# Patient Record
Sex: Male | Born: 1985
Health system: Southern US, Community
[De-identification: ages and names within clinical notes are randomized; demographics above are authoritative.]

## PROBLEM LIST (undated history)

## (undated) DIAGNOSIS — M7542 Impingement syndrome of left shoulder: Secondary | ICD-10-CM

## (undated) DIAGNOSIS — M25312 Other instability, left shoulder: Secondary | ICD-10-CM

## (undated) DIAGNOSIS — M24119 Other articular cartilage disorders, unspecified shoulder: Secondary | ICD-10-CM

## (undated) DIAGNOSIS — J45998 Other asthma: Secondary | ICD-10-CM

## (undated) DIAGNOSIS — K219 Gastro-esophageal reflux disease without esophagitis: Secondary | ICD-10-CM

## (undated) HISTORY — PX: WISDOM TOOTH EXTRACTION: SHX21

## (undated) HISTORY — PX: THYROGLOSSAL DUCT CYST: SHX297

## (undated) HISTORY — PX: TONSILLECTOMY AND ADENOIDECTOMY: SHX28

---

## 2009-11-03 ENCOUNTER — Ambulatory Visit (HOSPITAL_BASED_OUTPATIENT_CLINIC_OR_DEPARTMENT_OTHER): Admission: RE | Admit: 2009-11-03 | Discharge: 2009-11-03 | Payer: Self-pay | Admitting: Plastic Surgery

## 2009-11-03 HISTORY — PX: MASS EXCISION: SHX2000

## 2010-12-06 LAB — POCT HEMOGLOBIN-HEMACUE: Hemoglobin: 14.8 g/dL (ref 13.0–17.0)

## 2014-07-07 ENCOUNTER — Ambulatory Visit: Payer: Medicaid Other | Attending: Orthopedic Surgery

## 2014-07-07 DIAGNOSIS — M25519 Pain in unspecified shoulder: Secondary | ICD-10-CM | POA: Diagnosis not present

## 2014-07-07 DIAGNOSIS — Z5189 Encounter for other specified aftercare: Secondary | ICD-10-CM | POA: Diagnosis not present

## 2014-10-13 ENCOUNTER — Other Ambulatory Visit: Payer: Self-pay | Admitting: Orthopedic Surgery

## 2014-11-11 ENCOUNTER — Encounter (HOSPITAL_BASED_OUTPATIENT_CLINIC_OR_DEPARTMENT_OTHER): Payer: Self-pay | Admitting: *Deleted

## 2014-11-15 ENCOUNTER — Encounter (HOSPITAL_BASED_OUTPATIENT_CLINIC_OR_DEPARTMENT_OTHER): Payer: Self-pay | Admitting: *Deleted

## 2014-11-15 DIAGNOSIS — M24119 Other articular cartilage disorders, unspecified shoulder: Secondary | ICD-10-CM

## 2014-11-15 DIAGNOSIS — M7542 Impingement syndrome of left shoulder: Secondary | ICD-10-CM | POA: Insufficient documentation

## 2014-11-15 HISTORY — DX: Impingement syndrome of left shoulder: M75.42

## 2014-11-15 HISTORY — DX: Other articular cartilage disorders, unspecified shoulder: M24.119

## 2014-11-18 ENCOUNTER — Ambulatory Visit (HOSPITAL_BASED_OUTPATIENT_CLINIC_OR_DEPARTMENT_OTHER): Payer: Medicaid Other | Admitting: Anesthesiology

## 2014-11-18 ENCOUNTER — Encounter (HOSPITAL_BASED_OUTPATIENT_CLINIC_OR_DEPARTMENT_OTHER)
Admission: RE | Disposition: A | Discharge status: Home / Self Care | Payer: Self-pay | Source: Ambulatory Visit | Attending: Orthopedic Surgery

## 2014-11-18 ENCOUNTER — Encounter (HOSPITAL_BASED_OUTPATIENT_CLINIC_OR_DEPARTMENT_OTHER): Payer: Self-pay

## 2014-11-18 ENCOUNTER — Ambulatory Visit (HOSPITAL_BASED_OUTPATIENT_CLINIC_OR_DEPARTMENT_OTHER)
Admission: RE | Admit: 2014-11-18 | Discharge: 2014-11-18 | Disposition: A | Discharge status: Home / Self Care | Payer: Medicaid Other | Source: Ambulatory Visit | Attending: Orthopedic Surgery | Admitting: Orthopedic Surgery

## 2014-11-18 DIAGNOSIS — K219 Gastro-esophageal reflux disease without esophagitis: Secondary | ICD-10-CM | POA: Diagnosis not present

## 2014-11-18 DIAGNOSIS — M24112 Other articular cartilage disorders, left shoulder: Secondary | ICD-10-CM | POA: Insufficient documentation

## 2014-11-18 DIAGNOSIS — M7542 Impingement syndrome of left shoulder: Secondary | ICD-10-CM | POA: Diagnosis not present

## 2014-11-18 DIAGNOSIS — M25312 Other instability, left shoulder: Secondary | ICD-10-CM

## 2014-11-18 DIAGNOSIS — J45909 Unspecified asthma, uncomplicated: Secondary | ICD-10-CM | POA: Diagnosis not present

## 2014-11-18 HISTORY — DX: Other articular cartilage disorders, unspecified shoulder: M24.119

## 2014-11-18 HISTORY — DX: Other asthma: J45.998

## 2014-11-18 HISTORY — DX: Gastro-esophageal reflux disease without esophagitis: K21.9

## 2014-11-18 HISTORY — PX: SHOULDER ARTHROSCOPY WITH SUBACROMIAL DECOMPRESSION: SHX5684

## 2014-11-18 HISTORY — DX: Other instability, left shoulder: M25.312

## 2014-11-18 HISTORY — DX: Impingement syndrome of left shoulder: M75.42

## 2014-11-18 LAB — POCT HEMOGLOBIN-HEMACUE: Hemoglobin: 15.1 g/dL (ref 13.0–17.0)

## 2014-11-18 SURGERY — SHOULDER ARTHROSCOPY WITH SUBACROMIAL DECOMPRESSION
Anesthesia: Regional | Site: Shoulder | Laterality: Left

## 2014-11-18 SURGERY — SHOULDER ARTHROSCOPY WITH SUBACROMIAL DECOMPRESSION
Anesthesia: General | Laterality: Left

## 2014-11-18 MED ORDER — CEFAZOLIN SODIUM-DEXTROSE 2-3 GM-% IV SOLR
2.0000 g | INTRAVENOUS | Status: AC
  Administered 2014-11-18: 2 g via INTRAVENOUS

## 2014-11-18 MED ORDER — OXYCODONE HCL 5 MG PO TABS
ORAL_TABLET | ORAL | Status: AC
  Filled 2014-11-18: qty 1

## 2014-11-18 MED ORDER — PROPOFOL 10 MG/ML IV EMUL
INTRAVENOUS | Status: AC
  Filled 2014-11-18: qty 50

## 2014-11-18 MED ORDER — ONDANSETRON HCL 4 MG PO TABS: 4.0000 mg | ORAL_TABLET | Freq: Three times a day (TID) | ORAL | Status: DC | PRN

## 2014-11-18 MED ORDER — MIDAZOLAM HCL 2 MG/2ML IJ SOLN
INTRAMUSCULAR | Status: AC
  Filled 2014-11-18: qty 2

## 2014-11-18 MED ORDER — HYDROMORPHONE HCL 1 MG/ML IJ SOLN: 0.2500 mg | INTRAMUSCULAR | Status: DC | PRN

## 2014-11-18 MED ORDER — FENTANYL CITRATE 0.05 MG/ML IJ SOLN
INTRAMUSCULAR | Status: AC
  Filled 2014-11-18: qty 6

## 2014-11-18 MED ORDER — SODIUM CHLORIDE 0.9 % IR SOLN
Status: DC | PRN
  Administered 2014-11-18: 8000 mL

## 2014-11-18 MED ORDER — SENNA-DOCUSATE SODIUM 8.6-50 MG PO TABS: 2.0000 | ORAL_TABLET | Freq: Every day | ORAL | Status: DC

## 2014-11-18 MED ORDER — ONDANSETRON HCL 4 MG/2ML IJ SOLN
INTRAMUSCULAR | Status: DC | PRN
  Administered 2014-11-18: 4 mg via INTRAVENOUS

## 2014-11-18 MED ORDER — CEFAZOLIN SODIUM-DEXTROSE 2-3 GM-% IV SOLR: 2.0000 g | INTRAVENOUS | Status: DC

## 2014-11-18 MED ORDER — PROPOFOL 10 MG/ML IV BOLUS
INTRAVENOUS | Status: DC | PRN
  Administered 2014-11-18: 200 mg via INTRAVENOUS

## 2014-11-18 MED ORDER — BUPIVACAINE-EPINEPHRINE (PF) 0.5% -1:200000 IJ SOLN
INTRAMUSCULAR | Status: DC | PRN
  Administered 2014-11-18: 30 mL via PERINEURAL

## 2014-11-18 MED ORDER — FENTANYL CITRATE 0.05 MG/ML IJ SOLN
50.0000 ug | INTRAMUSCULAR | Status: DC | PRN
  Administered 2014-11-18: 100 ug via INTRAVENOUS

## 2014-11-18 MED ORDER — DEXAMETHASONE SODIUM PHOSPHATE 4 MG/ML IJ SOLN
INTRAMUSCULAR | Status: DC | PRN
  Administered 2014-11-18: 10 mg via INTRAVENOUS

## 2014-11-18 MED ORDER — MIDAZOLAM HCL 2 MG/2ML IJ SOLN
1.0000 mg | INTRAMUSCULAR | Status: DC | PRN
  Administered 2014-11-18: 2 mg via INTRAVENOUS

## 2014-11-18 MED ORDER — FENTANYL CITRATE 0.05 MG/ML IJ SOLN
INTRAMUSCULAR | Status: AC
  Filled 2014-11-18: qty 2

## 2014-11-18 MED ORDER — LACTATED RINGERS IV SOLN
INTRAVENOUS | Status: DC
  Administered 2014-11-18 (×3): via INTRAVENOUS

## 2014-11-18 MED ORDER — CEFAZOLIN SODIUM-DEXTROSE 2-3 GM-% IV SOLR
INTRAVENOUS | Status: AC
  Filled 2014-11-18: qty 50

## 2014-11-18 MED ORDER — MIDAZOLAM HCL 2 MG/ML PO SYRP: 12.0000 mg | ORAL_SOLUTION | Freq: Once | ORAL | Status: DC | PRN

## 2014-11-18 MED ORDER — BACLOFEN 10 MG PO TABS: 10.0000 mg | ORAL_TABLET | Freq: Three times a day (TID) | ORAL | Status: DC

## 2014-11-18 MED ORDER — SUCCINYLCHOLINE CHLORIDE 20 MG/ML IJ SOLN
INTRAMUSCULAR | Status: DC | PRN
  Administered 2014-11-18: 100 mg via INTRAVENOUS

## 2014-11-18 MED ORDER — FENTANYL CITRATE 0.05 MG/ML IJ SOLN
INTRAMUSCULAR | Status: DC | PRN
  Administered 2014-11-18: 50 ug via INTRAVENOUS

## 2014-11-18 MED ORDER — OXYCODONE-ACETAMINOPHEN 5-325 MG PO TABS: 1.0000 | ORAL_TABLET | Freq: Four times a day (QID) | ORAL | Status: DC | PRN

## 2014-11-18 MED ORDER — OXYCODONE HCL 5 MG PO TABS
5.0000 mg | ORAL_TABLET | Freq: Once | ORAL | Status: AC
  Administered 2014-11-18: 5 mg via ORAL

## 2014-11-18 SURGICAL SUPPLY — 61 items
ANCHOR SUT BIOCOMP LK 2.9X12.5 (Anchor) ×4 IMPLANT
BLADE CUTTER GATOR 3.5 (BLADE) ×4 IMPLANT
BLADE GREAT WHITE 4.2 (BLADE) IMPLANT
BLADE SURG 15 STRL LF DISP TIS (BLADE) IMPLANT
BLADE SURG 15 STRL SS (BLADE)
BUR OVAL 6.0 (BURR) ×4 IMPLANT
CANNULA 5.75X71 LONG (CANNULA) ×4 IMPLANT
CANNULA TWIST IN 8.25X7CM (CANNULA) ×4 IMPLANT
CLSR STERI-STRIP ANTIMIC 1/2X4 (GAUZE/BANDAGES/DRESSINGS) ×4 IMPLANT
DECANTER SPIKE VIAL GLASS SM (MISCELLANEOUS) IMPLANT
DRAPE INCISE IOBAN 66X45 STRL (DRAPES) ×4 IMPLANT
DRAPE SHOULDER BEACH CHAIR (DRAPES) ×4 IMPLANT
DRAPE U 20/CS (DRAPES) ×4 IMPLANT
DRAPE U-SHAPE 47X51 STRL (DRAPES) ×4 IMPLANT
DRSG PAD ABDOMINAL 8X10 ST (GAUZE/BANDAGES/DRESSINGS) ×4 IMPLANT
DURAPREP 26ML APPLICATOR (WOUND CARE) ×4 IMPLANT
ELECT REM PT RETURN 9FT ADLT (ELECTROSURGICAL)
ELECTRODE REM PT RTRN 9FT ADLT (ELECTROSURGICAL) IMPLANT
GAUZE SPONGE 4X4 12PLY STRL (GAUZE/BANDAGES/DRESSINGS) ×4 IMPLANT
GLOVE BIO SURGEON STRL SZ8 (GLOVE) ×4 IMPLANT
GLOVE BIOGEL PI IND STRL 7.0 (GLOVE) ×3 IMPLANT
GLOVE BIOGEL PI IND STRL 8 (GLOVE) ×6 IMPLANT
GLOVE BIOGEL PI INDICATOR 7.0 (GLOVE) ×1
GLOVE BIOGEL PI INDICATOR 8 (GLOVE) ×2
GLOVE ECLIPSE 6.5 STRL STRAW (GLOVE) ×8 IMPLANT
GLOVE ORTHO TXT STRL SZ7.5 (GLOVE) ×4 IMPLANT
GOWN STRL REUS W/ TWL LRG LVL3 (GOWN DISPOSABLE) ×3 IMPLANT
GOWN STRL REUS W/ TWL XL LVL3 (GOWN DISPOSABLE) ×6 IMPLANT
GOWN STRL REUS W/TWL LRG LVL3 (GOWN DISPOSABLE) ×1
GOWN STRL REUS W/TWL XL LVL3 (GOWN DISPOSABLE) ×2
IMMOBILIZER SHOULDER FOAM XLGE (SOFTGOODS) IMPLANT
IV NS IRRIG 3000ML ARTHROMATIC (IV SOLUTION) ×12 IMPLANT
KIT SHOULDER TRACTION (DRAPES) ×4 IMPLANT
LASSO 90 CVE QUICKPAS (DISPOSABLE) ×4 IMPLANT
MANIFOLD NEPTUNE II (INSTRUMENTS) ×4 IMPLANT
PACK ARTHROSCOPY DSU (CUSTOM PROCEDURE TRAY) ×4 IMPLANT
PACK BASIN DAY SURGERY FS (CUSTOM PROCEDURE TRAY) ×4 IMPLANT
SET ARTHROSCOPY TUBING (MISCELLANEOUS) ×1
SET ARTHROSCOPY TUBING LN (MISCELLANEOUS) ×3 IMPLANT
SHEET MEDIUM DRAPE 40X70 STRL (DRAPES) ×4 IMPLANT
SLEEVE SCD COMPRESS KNEE MED (MISCELLANEOUS) ×4 IMPLANT
SLING ARM IMMOBILIZER LRG (SOFTGOODS) ×4 IMPLANT
SLING ARM IMMOBILIZER MED (SOFTGOODS) IMPLANT
SLING ARM LRG ADULT FOAM STRAP (SOFTGOODS) IMPLANT
SLING ARM MED ADULT FOAM STRAP (SOFTGOODS) IMPLANT
SLING ARM XL FOAM STRAP (SOFTGOODS) IMPLANT
SUT FIBERWIRE #2 38 T-5 BLUE (SUTURE)
SUT MNCRL AB 4-0 PS2 18 (SUTURE) IMPLANT
SUT PDS AB 1 CT  36 (SUTURE)
SUT PDS AB 1 CT 36 (SUTURE) IMPLANT
SUT TIGER TAPE 7 IN WHITE (SUTURE) IMPLANT
SUT VIC AB 3-0 SH 27 (SUTURE)
SUT VIC AB 3-0 SH 27X BRD (SUTURE) IMPLANT
SUTURE FIBERWR #2 38 T-5 BLUE (SUTURE) IMPLANT
TAPE FIBER 2MM 7IN #2 BLUE (SUTURE) IMPLANT
TAPE SUT LABRALTAP WHT/BLK (SUTURE) ×4 IMPLANT
TOWEL OR 17X24 6PK STRL BLUE (TOWEL DISPOSABLE) ×4 IMPLANT
TOWEL OR NON WOVEN STRL DISP B (DISPOSABLE) ×4 IMPLANT
TUBE CONNECTING 20X1/4 (TUBING) IMPLANT
WAND STAR VAC 90 (SURGICAL WAND) ×4 IMPLANT
WATER STERILE IRR 1000ML POUR (IV SOLUTION) ×4 IMPLANT

## 2014-11-18 NOTE — Anesthesia Procedure Notes (Addendum)
Anesthesia Regional Block:  Interscalene brachial plexus block  Pre-Anesthetic Checklist: ,, timeout performed, Correct Patient, Correct Site, Correct Laterality, Correct Procedure, Correct Position, site marked, Risks and benefits discussed, pre-op evaluation,  At surgeon's request and post-op pain management  Laterality: Left  Prep: Maximum Sterile Barrier Precautions used and chloraprep       Needles:  Injection technique: Single-shot  Needle Type: Echogenic Stimulator Needle     Needle Length: 5cm 5 cm Needle Gauge: 22 and 22 G    Additional Needles:  Procedures: ultrasound guided (picture in chart) and nerve stimulator Interscalene brachial plexus block  Nerve Stimulator or Paresthesia:  Response: Biceps response,   Additional Responses:   Narrative:  Start time: 11/18/2014 10:20 AM End time: 11/18/2014 10:29 AM Injection made incrementally with aspirations every 5 mL. Anesthesiologist: Gaynelle AduFITZGERALD, WILLIAM E  Additional Notes: 2% Lidocaine skin wheel.    Procedure Name: Intubation Date/Time: 11/18/2014 12:53 PM Performed by: Zenia ResidesPAYNE, Eligh Rybacki D Pre-anesthesia Checklist: Patient identified, Emergency Drugs available, Suction available and Patient being monitored Patient Re-evaluated:Patient Re-evaluated prior to inductionOxygen Delivery Method: Circle System Utilized Preoxygenation: Pre-oxygenation with 100% oxygen Intubation Type: IV induction Ventilation: Mask ventilation without difficulty Laryngoscope Size: Mac and 3 Grade View: Grade I Tube type: Oral Number of attempts: 1 Airway Equipment and Method: Stylet and Oral airway Placement Confirmation: ETT inserted through vocal cords under direct vision,  positive ETCO2 and breath sounds checked- equal and bilateral Secured at: 23 cm Tube secured with: Tape Dental Injury: Teeth and Oropharynx as per pre-operative assessment

## 2014-11-18 NOTE — Anesthesia Preprocedure Evaluation (Addendum)
Anesthesia Evaluation  Patient identified by MRN, date of birth, ID band Patient awake    Reviewed: Allergy & Precautions, H&P , NPO status , Patient's Chart, lab work & pertinent test results  Airway Mallampati: II  TM Distance: >3 FB Neck ROM: Full    Dental no notable dental hx. (+) Teeth Intact, Dental Advisory Given   Pulmonary asthma ,  breath sounds clear to auscultation  Pulmonary exam normal       Cardiovascular negative cardio ROS  Rhythm:Regular Rate:Normal     Neuro/Psych negative neurological ROS  negative psych ROS   GI/Hepatic Neg liver ROS, GERD-  Medicated and Controlled,  Endo/Other  negative endocrine ROS  Renal/GU negative Renal ROS  negative genitourinary   Musculoskeletal   Abdominal   Peds  Hematology negative hematology ROS (+)   Anesthesia Other Findings   Reproductive/Obstetrics negative OB ROS                            Anesthesia Physical Anesthesia Plan  ASA: II  Anesthesia Plan: General and Regional   Post-op Pain Management:    Induction: Intravenous  Airway Management Planned: Oral ETT  Additional Equipment:   Intra-op Plan:   Post-operative Plan: Extubation in OR  Informed Consent: I have reviewed the patients History and Physical, chart, labs and discussed the procedure including the risks, benefits and alternatives for the proposed anesthesia with the patient or authorized representative who has indicated his/her understanding and acceptance.   Dental advisory given  Plan Discussed with: CRNA  Anesthesia Plan Comments:         Anesthesia Quick Evaluation

## 2014-11-18 NOTE — Anesthesia Postprocedure Evaluation (Signed)
  Anesthesia Post-op Note  Patient: Ian Stevens  Procedure(s) Performed: Procedure(s): LEFT SHOULDER ARTHROSCOPY, ACROMIOPLASTY, DEBRIDEMENT OF LABRAL TEAR, CAPSULORRHAPHY (Left) SHOULDER ATHROSCOPY WITH CAPSULORRHAPHY  Patient Location: PACU  Anesthesia Type:General and block  Level of Consciousness: awake and alert   Airway and Oxygen Therapy: Patient Spontanous Breathing  Post-op Pain: none  Post-op Assessment: Post-op Vital signs reviewed, Patient's Cardiovascular Status Stable and Respiratory Function Stable  Post-op Vital Signs: Reviewed  Filed Vitals:   11/18/14 1500  BP:   Pulse: 100  Temp:   Resp: 9    Complications: No apparent anesthesia complications

## 2014-11-18 NOTE — Progress Notes (Signed)
Assisted Dr. Edmond Fitzgerald with left, ultrasound guided, interscalene  block. Side rails up, monitors on throughout procedure. See vital signs in flow sheet. Tolerated Procedure well.  

## 2014-11-18 NOTE — H&P (Signed)
PREOPERATIVE H&P  Chief Complaint: Left shoulder pain  HPI: Ian Stevens is a 29 y.o. male who presents for preoperative history and physical with a diagnosis of left shoulder pain that has been persistent for at least 2 years, worse with lifting and activities of overhead motion. Pain is located laterally. He has had injections, physical therapy, and has had persistent symptoms.  Symptoms are rated as moderate to severe, and have been worsening.  This is significantly impairing activities of daily living.  He has elected for surgical management.   Past Medical History  Diagnosis Date  . GERD (gastroesophageal reflux disease)     no current med.  . Seasonal asthma     states has not used inhaler in years  . 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 Impingement syndrome of left shoulder 11/2014   Past Surgical History  Procedure Laterality Date  . Mass excision  11/03/2009    multiple masses forehead, left eyebrow, abd., left thumb  . Wisdom tooth extraction    . Tonsillectomy and adenoidectomy    . Thyroglossal duct cyst     History   Social History  . Marital Status: Single    Spouse Name: N/A  . Number of Children: N/A  . Years of Education: N/A   Social History Main Topics  . Smoking status: Never Smoker   . Smokeless tobacco: Never Used  . Alcohol Use: No  . Drug Use: No  . Sexual Activity: Not on file   Other Topics Concern  . None   Social History Narrative   History reviewed. No pertinent family history. No Known Allergies Prior to Admission medications   Not on File     Positive ROS: All other systems have been reviewed and were otherwise negative with the exception of those mentioned in the HPI and as above.  Physical Exam: General: Alert, no acute distress Cardiovascular: No pedal edema Respiratory: No cyanosis, no use of accessory musculature GI: No organomegaly, abdomen is soft and non-tender Skin: No lesions in  the area of chief complaint Neurologic: Sensation intact distally Psychiatric: Patient is competent for consent with normal mood and affect Lymphatic: No axillary or cervical lymphadenopathy  MUSCULOSKELETAL: Left shoulder has active motion 0 175 with positive impingement signs, no pain over the acromioclavicular joint, mild pain over the biceps, moderate Neer and Hawkins signs positive.  Assessment: Left shoulder impingement syndrome, possible labral pathology with persistent pain  Plan: Plan for Procedure(s): LEFT SHOULDER ARTHROSCOPY DEBRIDEMENT,ACROMIOPLASTY, possible labral repair  The risks benefits and alternatives were discussed with the patient including but not limited to the risks of nonoperative treatment, versus surgical intervention including infection, bleeding, nerve injury,  blood clots, cardiopulmonary complications, morbidity, mortality, among others, and they were willing to proceed. We have also discussed the risks for incomplete relief of symptoms, stiffness, among others.  Eulas PostLANDAU,Malon Siddall P, MD Cell [phone]   11/18/2014 9:20 AM

## 2014-11-18 NOTE — Discharge Instructions (Signed)
Diet: As you were doing prior to hospitalization   Shower:  May shower but keep the wounds dry, use an occlusive plastic wrap, NO SOAKING IN TUB.  If the bandage gets wet, change with a clean dry gauze.  Dressing:  You may change your dressing 3-5 days after surgery.  Then change the dressing daily with sterile gauze dressing.    There are sticky tapes (steri-strips) on your wounds and all the stitches are absorbable.  Leave the steri-strips in place when changing your dressings, they will peel off with time, usually 2-3 weeks.  Activity:  Increase activity slowly as tolerated, but follow the weight bearing instructions below.  No lifting or driving for 6 weeks.  Weight Bearing:   Sling at all times, ok for hand, wrist and elbow motion..    To prevent constipation: you may use a stool softener such as -  Colace (over the counter) 100 mg by mouth twice a day  Drink plenty of fluids (prune juice may be helpful) and high fiber foods Miralax (over the counter) for constipation as needed.    Itching:  If you experience itching with your medications, try taking only a single pain pill, or even half a pain pill at a time.  You may take up to 10 pain pills per day, and you can also use benadryl over the counter for itching or also to help with sleep.   Precautions:  If you experience chest pain or shortness of breath - call 911 immediately for transfer to the hospital emergency department!!  If you develop a fever greater that 101 F, purulent drainage from wound, increased redness or drainage from wound, or calf pain -- Call the office at [phone]                                                Follow- Up Appointment:  Please call for an appointment to be seen in 2 [place] - [phone]   Post Anesthesia Home Care Instructions  Activity: Get plenty of rest for the remainder of the day. A responsible adult should stay with you for 24 hours following the procedure.  For the next 24  hours, DO NOT: -Drive a car -Advertising copywriterperate machinery -Drink alcoholic beverages -Take any medication unless instructed by your physician -Make any legal decisions or sign important papers.  Meals: Start with liquid foods such as gelatin or soup. Progress to regular foods as tolerated. Avoid greasy, spicy, heavy foods. If nausea and/or vomiting occur, drink only clear liquids until the nausea and/or vomiting subsides. Call your physician if vomiting continues.  Special Instructions/Symptoms: Your throat may feel dry or sore from the anesthesia or the breathing tube placed in your throat during surgery. If this causes discomfort, gargle with warm salt water. The discomfort should disappear within 24 hours.   Regional Anesthesia Blocks  1. Numbness or the inability to move the "blocked" extremity may last from 3-48 hours after placement. The length of time depends on the medication injected and your individual response to the medication. If the numbness is not going away after 48 hours, call your surgeon.  2. The extremity that is blocked will need to be protected until the numbness is gone and the  Strength has returned. Because you cannot feel it, you will need to take extra care to avoid injury. Because it may  be weak, you may have difficulty moving it or using it. You may not know what position it is in without looking at it while the block is in effect.  3. For blocks in the legs and feet, returning to weight bearing and walking needs to be done carefully. You will need to wait until the numbness is entirely gone and the strength has returned. You should be able to move your leg and foot normally before you try and bear weight or walk. You will need someone to be with you when you first try to ensure you do not fall and possibly risk injury.  4. Bruising and tenderness at the needle site are common side effects and will resolve in a few days.  5. Persistent numbness or new problems with movement  should be communicated to the surgeon or the [facility] [phone] [facility] [phone]).

## 2014-11-18 NOTE — Op Note (Signed)
11/18/2014  2:17 PM  PATIENT:  Ian Stevens    PRE-OPERATIVE DIAGNOSIS:  Left shoulder impingement syndrome, possible labral tear  POST-OPERATIVE DIAGNOSIS:  Left shoulder impingement syndrome, multidirectional instability, primarily anterior, with small inferior labral tear  PROCEDURE:  LEFT SHOULDER ARTHROSCOPY, ACROMIOPLASTY, DEBRIDEMENT OF LABRAL TEAR, CAPSULORRHAPHY, SHOULDER ATHROSCOPY WITH CAPSULORRHAPHY  SURGEON:  Eulas PostLANDAU,Lizandro Spellman P, MD  PHYSICIAN ASSISTANT: Janace LittenBrandon Parry, OPA-C, present and scrubbed throughout the case, critical for completion in a timely fashion, and for retraction, instrumentation, and closure.  ANESTHESIA:   General  PREOPERATIVE INDICATIONS:  Ian Stevens is a  29 y.o. male who had recurrent episodic left shoulder pain after extensive activity. He failed injections, activity modification, physical therapy, anti-inflammatories, for over 2 years.  The risks benefits and alternatives were discussed with the patient preoperatively including but not limited to the risks of infection, bleeding, nerve injury, cardiopulmonary complications, the need for revision surgery, among others, and the patient was willing to proceed.  OPERATIVE IMPLANTS: Arthrex bio composite 2.9 mm push lock 1 with inverted labral tape  OPERATIVE FINDINGS: Shoulder had full motion during examination under anesthesia, in fact was hypermobile. I could subluxate him anteriorly, and felt a reasonable click. The articular cartilage surfaces were all totally normal, but there was a small inferior labral tear that was at the 6:00 position. Because of his multidirectional instability and patulous capsule I was actually able to access the 6:00 position with the shaver relatively easily. The posterior labrum, superior labrum, biceps tendon, subscapularis, supraspinatus, infraspinatus were all completely normal. The biceps pulley was completely normal. The shoulder did appear to be unstable  anteriorly.  From the subacromial space the bursa was somewhat thickened, and there was some fraying of the CA ligament, but the rotator cuff was completely intact from above.  OPERATIVE PROCEDURE: The patient is brought to the operating room and placed in supine position. Gen. anesthesia was administered. IV antibiotics were given. Time out was performed. The left upper extremity was prepped and draped in the sterile usual sterile fashion after examination under anesthesia demonstrated primarily anterior instability.  Diagnostic arthroscopy demonstrated the above-named findings. I used the arthroscopic shaver to debride the inferior labrum, and evaluated the patulousness of the capsule, and decided to go to the subacromial space to inspect the rotator cuff prior to proceeding with capsulorrhaphy.  The subacromial space and performed a complete bursectomy, evaluated the CA ligament and did in fact find some fraying, the acromioclavicular joint was pristine and I did not expose that. After releasing the CA ligament I performed a light acromioplasty with a bur. I confirmed appropriate acromioplasty from the lateral view as well.  I then went back into the joint, placed an anterior cannula as well as an anterior viewing portal, with the posterior cannula, and used a rasp to prepare the capsule, and then placed a horizontal mattress suture on either side of the inferior glenohumeral ligament. The inferior pass did not go through the labrum, but exited just adjacent to the labrum. The superior past did go just under the labrum, and I placed the anchor right at the exit point where the suture exited the labrum. This provided excellent imbrication of the inferior glenohumeral ligament and reduction in capsular volume correcting the multidirectional instability/capsular redundancy.  The instruments were then removed, the portals closed with Monocryl followed by Steri-Strips and sterile gauze. He was awakened and  returned to the PACU in stable and satisfactory condition. The patient tolerated the procedure well and there were no complications.

## 2014-11-18 NOTE — Transfer of Care (Signed)
Immediate Anesthesia Transfer of Care Note  Patient: Ian Stevens Friday  Procedure(s) Performed: Procedure(s): LEFT SHOULDER ARTHROSCOPY, ACROMIOPLASTY, DEBRIDEMENT OF LABRAL TEAR, CAPSULORRHAPHY (Left) SHOULDER ATHROSCOPY WITH CAPSULORRHAPHY  Patient Location: PACU  Anesthesia Type:General and Regional  Level of Consciousness: awake and alert   Airway & Oxygen Therapy: Patient Spontanous Breathing and Patient connected to face mask oxygen  Post-op Assessment: Report given to RN and Post -op Vital signs reviewed and stable  Post vital signs: Reviewed and stable  Last Vitals:  Filed Vitals:   11/18/14 1035  BP: 120/62  Pulse: 90  Temp:   Resp: 24    Complications: No apparent anesthesia complications

## 2014-11-21 ENCOUNTER — Encounter (HOSPITAL_BASED_OUTPATIENT_CLINIC_OR_DEPARTMENT_OTHER): Payer: Self-pay | Admitting: Orthopedic Surgery

## 2015-01-05 ENCOUNTER — Ambulatory Visit: Payer: Medicaid Other | Attending: Orthopedic Surgery

## 2015-01-05 DIAGNOSIS — M25622 Stiffness of left elbow, not elsewhere classified: Secondary | ICD-10-CM | POA: Diagnosis not present

## 2015-01-05 DIAGNOSIS — M79622 Pain in left upper arm: Secondary | ICD-10-CM | POA: Diagnosis not present

## 2015-01-05 NOTE — Therapy (Signed)
[facility] [address] [place], [place], [address] Phone: [phone]   Fax:  [phone]  Physical Therapy Evaluation  Patient Details  Name: Ian Stevens MRN: [id number] Date of Birth: [birth year] Referring Provider:  Teryl Lucy, MD  Encounter Date: 01/05/2015      PT End of Session - 01/05/15 0940    Visit Number 1   Number of Visits 4   Date for PT Re-Evaluation 03/16/15   PT Start Time 0845   PT Stop Time 0930   PT Time Calculation (min) 45 min   Activity Tolerance Patient tolerated treatment well   Behavior During Therapy [facility] for tasks assessed/performed      Past Medical History  Diagnosis Date  . GERD (gastroesophageal reflux disease)     no current med.  . Seasonal asthma     states has not used inhaler in years  . 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 Impingement syndrome of left shoulder 11/2014  . Instability of left shoulder joint 11/18/2014    Past Surgical History  Procedure Laterality Date  . Mass excision  11/03/2009    multiple masses forehead, left eyebrow, abd., left thumb  . Wisdom tooth extraction    . Tonsillectomy and adenoidectomy    . Thyroglossal duct cyst    . Shoulder arthroscopy with subacromial decompression Left 11/18/2014    Procedure: LEFT SHOULDER ARTHROSCOPY, ACROMIOPLASTY, DEBRIDEMENT OF LABRAL TEAR, CAPSULORRHAPHY;  Surgeon: Teryl Lucy, MD;  Location: [facility];  Service: Orthopedics;  Laterality: Left;    There were no vitals filed for this visit.  Visit Diagnosis:  Stiffness of left upper arm joint - Plan: PT plan of care cert/re-cert  Pain of left upper arm - Plan: PT plan of care cert/re-cert      Subjective Assessment - 01/05/15 0851    Subjective He reports dull soreness possibly due to sleeping on shoulder.    Pertinent History He reports shoulder pain for 5-6 years. 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 Pain worsenened with activity.    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    Currently in Pain? Yes   Pain Score 2   mild   Pain Location Shoulder   Pain Orientation Left   Pain Descriptors / Indicators Dull   Pain Type Surgical pain   Pain Onset More than a month ago   Pain Frequency Intermittent   Aggravating Factors  Using arm, exercise   Pain Relieving Factors medication and rest   Effect of Pain on Daily Activities Limited as above   Multiple Pain Sites No            [facility] PT Assessment - 01/05/15 0844    Assessment   Medical Diagnosis Arthroscope LT shoulder with labral repair   Onset Date 11/18/14   Precautions   Precaution Comments No lifting any weight.    Restrictions   Weight Bearing Restrictions Yes   Balance Screen   Has the patient fallen in the past 6 months No   Has the patient had a decrease in activity level because of a fear of falling?  No   Is the patient reluctant to leave their home because of a fear of falling?  No   Prior Function   Level of Independence Independent with basic ADLs   Cognition   Overall Cognitive Status Within  Functional Limits for tasks assessed   Observation/Other Assessments   Focus on Therapeutic Outcomes (FOTO)  50%   ROM / Strength   AROM / PROM / Strength AROM;PROM;Strength   AROM   AROM Assessment Site Shoulder   Right/Left Shoulder Right;Left   Right Shoulder Flexion 150 Degrees   Right Shoulder ABduction 155 Degrees   Right Shoulder Internal Rotation 66 Degrees   Right Shoulder External Rotation 108 Degrees   Right Shoulder Horizontal ABduction 33 Degrees   Right Shoulder Horizontal  ADduction 23 Degrees   Left Shoulder Flexion 83 Degrees   Left Shoulder ABduction 85 Degrees   Left Shoulder Internal Rotation 40 Degrees   Left Shoulder External Rotation 25 Degrees   Left Shoulder Horizontal ABduction 10 Degrees   Left  Shoulder Horizontal ADduction 10 Degrees   PROM   PROM Assessment Site Shoulder   Right/Left Shoulder Left   Strength   Strength Assessment Site Shoulder   Right/Left Shoulder Left   Ambulation/Gait   Gait Comments Normal                           PT Education - 01/05/15 0940    Education provided Yes   Education Details POC , HEP   Person(s) Educated Patient   Methods Explanation;Demonstration;Tactile cues;Verbal cues;Handout   Comprehension Returned demonstration;Verbalized understanding             PT Long Term Goals - 01/05/15 0945    PT LONG TERM GOAL #1   Title He will be independent with all HEP issued as of last visit   Time 10   Period Weeks   Status New   PT LONG TERM GOAL #2   Title Full range of Lt shoulde and return to working out for strength and return to work.    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 Now at 6-7 week in protocol. He will be in week 8 probably when he return and we will advance to that level at that time . He should progress well if he commits to HEP.    Pt will benefit from skilled therapeutic intervention in order to improve on the following deficits Decreased range of motion;Pain;Impaired UE functional use;Decreased strength   Rehab Potential Good   PT Frequency --  3 visits over 8 weeks to 10 weeks   PT Treatment/Interventions Cryotherapy;Patient/family education;Therapeutic exercise;Manual techniques;Passive range of motion   PT Next Visit Plan Review rnge and HEP and progress to appropriate level   PT Home Exercise Plan isometric and cane exercises   Consulted and Agree with Plan of Care Patient         Problem List Patient  Active Problem List   Diagnosis Date Noted  . Instability of left shoulder joint 11/18/2014    Caprice RedChasse, Leam Madero M PT 01/05/2015, 9:50 AM  [facility] [address] [place], [place], [id number] Phone: [phone]   Fax:  [phone]

## 2015-01-05 NOTE — Patient Instructions (Signed)
Isometric IR/ER , supine cane exercise and prone scapula retraction x/day 10-15 reps, hold 5-10 sec(isometrics

## 2015-01-05 NOTE — Therapy (Signed)
[facility] [address] [place], [place], [address] Phone: [phone]   Fax:  [phone]  Physical Therapy Evaluation  Patient Details  Name: Ian Stevens MRN: [id number] Date of Birth: Mar 23, 1986 Referring Provider:  Teryl Lucy, MD  Encounter Date: 01/05/2015      PT End of Session - 01/05/15 0940    Visit Number 1   Number of Visits 4   Date for PT Re-Evaluation 03/16/15   PT Start Time 0845   PT Stop Time 0930   PT Time Calculation (min) 45 min   Activity Tolerance Patient tolerated treatment well   Behavior During Therapy [facility] for tasks assessed/performed      Past Medical History  Diagnosis Date  . GERD (gastroesophageal reflux disease)     no current med.  . Seasonal asthma     states has not used inhaler in years  . 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 Impingement syndrome of left shoulder 11/2014  . Instability of left shoulder joint 11/18/2014    Past Surgical History  Procedure Laterality Date  . Mass excision  11/03/2009    multiple masses forehead, left eyebrow, abd., left thumb  . Wisdom tooth extraction    . Tonsillectomy and adenoidectomy    . Thyroglossal duct cyst    . Shoulder arthroscopy with subacromial decompression Left 11/18/2014    Procedure: LEFT SHOULDER ARTHROSCOPY, ACROMIOPLASTY, DEBRIDEMENT OF LABRAL TEAR, CAPSULORRHAPHY;  Surgeon: Teryl Lucy, MD;  Location: [facility];  Service: Orthopedics;  Laterality: Left;    There were no vitals filed for this visit.  Visit Diagnosis:  Stiffness of left upper arm joint - Plan: PT plan of care cert/re-cert  Pain of left upper arm - Plan: PT plan of care cert/re-cert      Subjective Assessment - 01/05/15 0851    Subjective He reports dull soreness possibly due to sleeping on shoulder.    Pertinent History He reports shoulder pain for 5-6 years. 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 Pain worsenened with activity.    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    Currently in Pain? Yes   Pain Score 2   mild   Pain Location Shoulder   Pain Orientation Left   Pain Descriptors / Indicators Dull   Pain Type Surgical pain   Pain Onset More than a month ago   Pain Frequency Intermittent   Aggravating Factors  Using arm, exercise   Pain Relieving Factors medication and rest   Effect of Pain on Daily Activities Limited as above   Multiple Pain Sites No            [facility] PT Assessment - 01/05/15 0844    Assessment   Medical Diagnosis Arthroscope LT shoulder with labral repair   Onset Date 11/18/14   Precautions   Precaution Comments No lifting any weight.    Restrictions   Weight Bearing Restrictions Yes   Balance Screen   Has the patient fallen in the past 6 months No   Has the patient had a decrease in activity level because of a fear of falling?  No   Is the patient reluctant to leave their home because of a fear of falling?  No   Prior Function   Level of Independence Independent with basic ADLs   Cognition   Overall Cognitive Status Within  Functional Limits for tasks assessed   Observation/Other Assessments   Focus on Therapeutic Outcomes (FOTO)  50%   ROM / Strength   AROM / PROM / Strength AROM;PROM;Strength   AROM   AROM Assessment Site Shoulder   Right/Left Shoulder Right;Left   Right Shoulder Flexion 150 Degrees   Right Shoulder ABduction 155 Degrees   Right Shoulder Internal Rotation 66 Degrees   Right Shoulder External Rotation 108 Degrees   Right Shoulder Horizontal ABduction 33 Degrees   Right Shoulder Horizontal  ADduction 23 Degrees   Left Shoulder Flexion 83 Degrees   Left Shoulder ABduction 85 Degrees   Left Shoulder Internal Rotation 40 Degrees   Left Shoulder External Rotation 25 Degrees   Left Shoulder Horizontal ABduction 10 Degrees   Left  Shoulder Horizontal ADduction 10 Degrees   PROM   PROM Assessment Site Shoulder   Right/Left Shoulder Left   Strength   Strength Assessment Site Shoulder   Right/Left Shoulder Left   Ambulation/Gait   Gait Comments Normal                           PT Education - 01/05/15 0940    Education provided Yes   Education Details POC , HEP   Person(s) Educated Patient   Methods Explanation;Demonstration;Tactile cues;Verbal cues;Handout   Comprehension Returned demonstration;Verbalized understanding             PT Long Term Goals - 01/05/15 1344    PT LONG TERM GOAL #1   Title He will be independent with all HEP issued as of last visit   Baseline No HEP   Time 10   Period Weeks   Status New   PT LONG TERM GOAL #2   Title Full range of Lt shoulder and return to working out for strength and return to work.    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 Now at 6-7 week in protocol. He will be in week 8 probably when he return and we will advance to that level at that time . He should progress well if he commits to HEP.    Pt will benefit from skilled therapeutic intervention in order to improve on the following deficits Decreased range of motion;Pain;Impaired UE functional use;Decreased strength   Rehab Potential Good   PT Frequency --  3 visits over 8 weeks to 10 weeks   PT Treatment/Interventions Cryotherapy;Patient/family education;Therapeutic exercise;Manual techniques;Passive range of motion   PT Next Visit Plan Review rnge and HEP and progress to  appropriate level   PT Home Exercise Plan isometric and cane exercises   Consulted and Agree with Plan of Care Patient         Problem List Patient Active Problem List   Diagnosis Date Noted  . Instability of left shoulder joint 11/18/2014    Caprice RedChasse, Paschal Blanton M PT 01/05/2015, 1:48 PM  [facility] [address] [place], [place], [address] Phone: [phone]   Fax:  [phone]

## 2015-01-30 ENCOUNTER — Ambulatory Visit: Payer: Medicaid Other | Attending: Orthopedic Surgery

## 2015-01-30 DIAGNOSIS — M25622 Stiffness of left elbow, not elsewhere classified: Secondary | ICD-10-CM | POA: Diagnosis not present

## 2015-01-30 DIAGNOSIS — M79622 Pain in left upper arm: Secondary | ICD-10-CM | POA: Insufficient documentation

## 2015-01-30 NOTE — Patient Instructions (Signed)
Flexibility: Corner Stretch   Standing in corner with hands just above shoulder level and feet ____ inches from corner, lean forward until a comfortable stretch is felt across chest. Hold _15-30___ seconds. Repeat _3-4___ times per set. Do __1-2__ sets per session. Do _3___ sessions per day.  http://orth.exer.us/342                          ALSO SLIDE ARM/HAND IN CORNER AS HIGH AS ABLE AND LEAN INTO CORNER.             USE PULLEY OVER HEAD AND FROM THE SIDE STRETCHING OVER HEAD 2-5 MIN STRETCHING IN SET OF 15-30 SEC.   PENDULUM WITH WEIGHT SMALL CIRCLES IF CAN KEEP SHOULDER RELAXED  Copyright  VHI. All rights reserved     ALSO DO  BELT STRETCXH FROM CABINET BEHIND BACK REPS AND HOLD AS ABOVE

## 2015-01-30 NOTE — Therapy (Signed)
[facility] [address] [place], [place], [address] Phone: [phone]   Fax:  [phone]  Physical Therapy Treatment  Patient Details  Name: Ian Eaglesicholas Beidleman MRN: [id number] Date of Birth: 08/25/1986 Referring Provider:  Swazil[place]Jordan, Betty G, MD  Encounter Date: 01/30/2015      PT End of Session - 01/30/15 0705    Visit Number 2   Number of Visits 4   Date for PT Re-Evaluation 03/16/15   PT Start Time 0655   PT Stop Time 0745   PT Time Calculation (min) 50 min   Activity Tolerance Patient tolerated treatment well   Behavior During Therapy [facility] for tasks assessed/performed      Past Medical History  Diagnosis Date  . GERD (gastroesophageal reflux disease)     no current med.  . Seasonal asthma     states has not used inhaler in years  . 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 Impingement syndrome of left shoulder 11/2014  . Instability of left shoulder joint 11/18/2014    Past Surgical History  Procedure Laterality Date  . Mass excision  11/03/2009    multiple masses forehead, left eyebrow, abd., left thumb  . Wisdom tooth extraction    . Tonsillectomy and adenoidectomy    . Thyroglossal duct cyst    . Shoulder arthroscopy with subacromial decompression Left 11/18/2014    Procedure: LEFT SHOULDER ARTHROSCOPY, ACROMIOPLASTY, DEBRIDEMENT OF LABRAL TEAR, CAPSULORRHAPHY;  Surgeon: Teryl LucyJoshua Landau, MD;  Location: [facility];  Service: Orthopedics;  Laterality: Left;    There were no vitals filed for this visit.  Visit Diagnosis:  Stiffness of left upper arm joint      Subjective Assessment - 01/30/15 0656    Subjective He reports continued pain in LT shoulder. Today no pain. Pain only one day.    Currently in Pain? No/denies   Pain Relieving Factors Uses ibuprophen when needed.    Multiple Pain Sites No            [facility] PT Assessment - 01/30/15 0657    AROM   Left  Shoulder Flexion 121 Degrees   Left Shoulder ABduction 130 Degrees   Left Shoulder Internal Rotation 40 Degrees   Left Shoulder External Rotation 30 Degrees   Left Shoulder Horizontal ABduction 20 Degrees   Left Shoulder Horizontal ADduction 10 Degrees   Strength   Left Shoulder Extension 5/5   Left Shoulder ABduction 5/5   Left Shoulder Internal Rotation 5/5   Left Shoulder External Rotation 5/5   Left Shoulder Horizontal ABduction 5/5   Left Shoulder Horizontal ADduction 5/5                     [facility] Adult PT Treatment/Exercise - 01/30/15 0701    Exercises   Exercises Shoulder;Neck   Neck Exercises: Machines for Strengthening   UBE (Upper Arm Bike) L2 5 min   Shoulder Exercises: Standing   Flexion AROM;Left  25 reps UE ranger   Shoulder Exercises: Pulleys   Flexion 2 minutes   ABduction 2 minutes    All HEP issued with patient were instructed and demonstrated  by PT. Also active ER stretch with UE ranger 25 reps            PT Education - 01/30/15 0734    Education provided Yes   Education Details HEP stretching   Person(s) Educated Patient   Methods Explanation;Demonstration;Tactile cues;Verbal cues;Handout   Comprehension  Returned demonstration;Verbalized understanding             PT Long Term Goals - 01/05/15 1344    PT LONG TERM GOAL #1   Title He will be independent with all HEP issued as of last visit   Baseline No HEP   Time 10   Period Weeks   Status New   PT LONG TERM GOAL #2   Title Full range of Lt shoulder and return to working out for strength and return to work.    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 Instability of left shoulder joint 11/18/2014    Caprice RedChasse, Farra Nikolic M PT 01/30/2015, 7:40 AM  [facility] [address] [place], [place], [address] Phone: [phone]   Fax:  [phone]

## 2015-02-15 ENCOUNTER — Ambulatory Visit: Payer: Medicaid Other | Attending: Orthopedic Surgery

## 2015-02-15 DIAGNOSIS — R29898 Other symptoms and signs involving the musculoskeletal system: Secondary | ICD-10-CM

## 2015-02-15 DIAGNOSIS — M25622 Stiffness of left elbow, not elsewhere classified: Secondary | ICD-10-CM | POA: Diagnosis not present

## 2015-02-15 DIAGNOSIS — M79622 Pain in left upper arm: Secondary | ICD-10-CM | POA: Diagnosis present

## 2015-02-15 NOTE — Patient Instructions (Signed)
Issued from cabinet modification of Tband unattached  With 3 pounds 4-5x/week goal of 30 reps start 10-15 reps for overhead reach , abduction to 90 degrees and ext rotation. With abduction.  And [place] prone with extenison and ER and hor abduction from cabinet. Counter pushup position with protraction and body lift RT and LT goal 30 reps

## 2015-02-15 NOTE — Therapy (Signed)
[facility] Outpatient Rehabilitation [facility] [address] [place], [place], [address] Phone: [phone]   Fax:  [phone]  Physical Therapy Treatment  Patient Details  Name: Ian Stevens MRN: [id number] Date of Birth: 1985-12-01 Referring Provider:  Teryl Lucy, MD  Encounter Date: 02/15/2015      PT End of Session - 02/15/15 0945    Visit Number 3   Number of Visits 4   Date for PT Re-Evaluation 03/16/15   PT Start Time 0845   PT Stop Time 0935   PT Time Calculation (min) 50 min   Activity Tolerance Patient tolerated treatment well   Behavior During Therapy [facility] for tasks assessed/performed      Past Medical History  Diagnosis Date  . GERD (gastroesophageal reflux disease)     no current med.  . Seasonal asthma     states has not used inhaler in years  . 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 Impingement syndrome of left shoulder 11/2014  . Instability of left shoulder joint 11/18/2014    Past Surgical History  Procedure Laterality Date  . Mass excision  11/03/2009    multiple masses forehead, left eyebrow, abd., left thumb  . Wisdom tooth extraction    . Tonsillectomy and adenoidectomy    . Thyroglossal duct cyst    . Shoulder arthroscopy with subacromial decompression Left 11/18/2014    Procedure: LEFT SHOULDER ARTHROSCOPY, ACROMIOPLASTY, DEBRIDEMENT OF LABRAL TEAR, CAPSULORRHAPHY;  Surgeon: Teryl Lucy, MD;  Location: [facility];  Service: Orthopedics;  Laterality: Left;    There were no vitals filed for this visit.  Visit Diagnosis:  Stiffness of left upper arm joint  Pain of left upper arm  Weakness of left arm      Subjective Assessment - 02/15/15 0852    Subjective No pain   Currently in Pain? No/denies            [facility] PT Assessment - 02/15/15 0902    AROM   Left Shoulder Flexion 121 Degrees   Left Shoulder ABduction 146 Degrees   Left Shoulder External Rotation 35  Degrees   Left Shoulder Horizontal ABduction 17 Degrees   Left Shoulder Horizontal ADduction 130 Degrees                     [facility] Adult PT Treatment/Exercise - 02/15/15 0857    Neck Exercises: Machines for Strengthening   UBE (Upper Arm Bike) 90 RPM 3 min forward and 3 back   Neck Exercises: Standing   Wall Push Ups 10 reps   Wall Push Ups Limitations RT and LT from counter protraction with hand lift 6 inches  with protraction   Shoulder Exercises: Seated   Elevation Strengthening;Left;12 reps   Elevation Weight (lbs) 5 pounds   Shoulder Exercises: Prone   Extension Left;12 reps   Extension Weight (lbs) 3   External Rotation Strengthening;Left;12 reps   External Rotation Weight (lbs) 3   Horizontal ABduction 1 Strengthening;Left;12 reps   Horizontal ABduction 1 Weight (lbs) 3                     PT Long Term Goals - 02/15/15 0950    PT LONG TERM GOAL #1   Title He will be independent with all HEP issued as of last visit   Status On-going   PT LONG TERM GOAL #2   Title Full range of Lt shoulder and return to working out  for strength and return to work.    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 BNeeds to progress with strength and gentle range . He has one more visit and we will add to strength program as totolerated He will progress at home to 30 reps 5 pounds aslong as no pain   PT Next Visit Plan Progress strength and stability exercise, check range      FOTO   PT Home Exercise Plan PRE and stabilkity exercises   Consulted and Agree with Plan of Care Patient        Problem List Patient Active Problem List   Diagnosis Date Noted  . Instability of left shoulder joint 11/18/2014     Caprice RedChasse, Don Tiu M PT 02/15/2015, 9:52 AM  [facility] [address] [place], [place], [address] Phone: [phone]   Fax:  [phone]

## 2015-03-13 ENCOUNTER — Ambulatory Visit: Payer: Medicaid Other

## 2015-03-13 DIAGNOSIS — R29898 Other symptoms and signs involving the musculoskeletal system: Secondary | ICD-10-CM

## 2015-03-13 DIAGNOSIS — M25622 Stiffness of left elbow, not elsewhere classified: Secondary | ICD-10-CM | POA: Diagnosis not present

## 2015-03-13 DIAGNOSIS — M79622 Pain in left upper arm: Secondary | ICD-10-CM | POA: Diagnosis present

## 2015-03-13 NOTE — Therapy (Signed)
[place], [place], [address] Phone: [phone]   Fax:  [phone]  Physical Therapy Treatment  Patient Details  Name: Ian Stevens MRN: [id number] Date of Birth: 09/05/1986 Referring Provider:  Martin[place], Betty G, MD  Encounter Date: 03/13/2015      PT End of Session - 03/13/15 0840    Visit Number 4   PT Start Time 0750   PT Stop Time 0840   PT Time Calculation (min) 50 min   Activity Tolerance Patient tolerated treatment well   Behavior During Therapy [facility] for tasks assessed/performed      Past Medical History  Diagnosis Date  . GERD (gastroesophageal reflux disease)     no current med.  . Seasonal asthma     states has not used inhaler in years  . 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 Impingement syndrome of left shoulder 11/2014  . Instability of left shoulder joint 11/18/2014    Past Surgical History  Procedure Laterality Date  . Mass excision  11/03/2009    multiple masses forehead, left eyebrow, abd., left thumb  . Wisdom tooth extraction    . Tonsillectomy and adenoidectomy    . Thyroglossal duct cyst    . Shoulder arthroscopy with subacromial decompression Left 11/18/2014    Procedure: LEFT SHOULDER ARTHROSCOPY, ACROMIOPLASTY, DEBRIDEMENT OF LABRAL TEAR, CAPSULORRHAPHY;  Surgeon: Marchia Bond, MD;  Location: [facility];  Service: Orthopedics;  Laterality: Left;    There were no vitals filed for this visit.  Visit Diagnosis:  Stiffness of left upper arm joint  Weakness of left arm          [facility] PT Assessment - 03/13/15 0757    AROM   Left Shoulder Flexion 125 Degrees   Left Shoulder ABduction 146 Degrees   Left Shoulder Internal Rotation 65 Degrees   Left Shoulder External Rotation 74 Degrees   Left Shoulder Horizontal ABduction 20 Degrees   Left Shoulder Horizontal ADduction 140 Degrees   Strength   Left Shoulder  Extension 5/5   Left Shoulder ABduction 5/5   Left Shoulder Internal Rotation 5/5   Left Shoulder External Rotation 5/5   Left Shoulder Horizontal ABduction 5/5   Left Shoulder Horizontal ADduction 5/5       Treatment included HEP of UBE 3 min for and 3 back at L2 , pulleys for overhead range and stability exercises with body blade and body weight                      PT Education - 03/13/15 0840    Education provided Yes   Education Details HEP stability exercies   Person(s) Educated Patient   Methods Explanation;Demonstration;Tactile cues;Verbal cues;Handout   Comprehension Returned demonstration             PT Long Term Goals - 03/13/15 0841    PT LONG TERM GOAL #1   Title He will be independent with all HEP issued as of last visit   Status Achieved   PT LONG TERM GOAL #2   Title Full range of Lt shoulder and return to working out for strength and return to work.    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 He is somewht less stable on LT shoulder but without pain.         Problem List Patient Active Problem List   Diagnosis Date Noted  . Instability of left shoulder joint 11/18/2014    Darrel Hoover PT 03/13/2015, 8:43 AM  [facility] [address] [place], [place], [address] Phone: [phone]   Fax:  [phone]   PHYSICAL THERAPY DISCHARGE SUMMARY  Visits from Start of Care: 4  Current functional level related to goals / functional outcomes: See above   Remaining deficits: Stiffness of Lt shoulder and decreaaed stability strength Lt shoulder. He should be able to address these over  time independently   Education / Equipment: HEP Plan: Patient agrees to discharge.  Patient goals were met. Patient is being discharged due to meeting the stated rehab goals.  ?????

## 2015-03-13 NOTE — Patient Instructions (Signed)
From Pilates booklet prone leg pull prep and side planks and progression for future. Suggestions for body blade type exercise for home and caution to not go past neutral with all loaded exercises. Cautioned pt to be patient and continue to stretch.

## 2015-11-09 ENCOUNTER — Ambulatory Visit (HOSPITAL_BASED_OUTPATIENT_CLINIC_OR_DEPARTMENT_OTHER)
Admission: RE | Admit: 2015-11-09 | Discharge: 2015-11-09 | Disposition: A | Discharge status: Home / Self Care | Payer: Medicaid Other | Source: Ambulatory Visit | Attending: Family Medicine | Admitting: Family Medicine

## 2015-11-09 ENCOUNTER — Other Ambulatory Visit (HOSPITAL_BASED_OUTPATIENT_CLINIC_OR_DEPARTMENT_OTHER): Payer: Self-pay | Admitting: Family Medicine

## 2015-11-09 DIAGNOSIS — M25552 Pain in left hip: Secondary | ICD-10-CM

## 2015-11-10 ENCOUNTER — Other Ambulatory Visit (HOSPITAL_BASED_OUTPATIENT_CLINIC_OR_DEPARTMENT_OTHER): Payer: Self-pay | Admitting: Family Medicine

## 2015-11-10 ENCOUNTER — Ambulatory Visit (HOSPITAL_BASED_OUTPATIENT_CLINIC_OR_DEPARTMENT_OTHER)
Admission: RE | Admit: 2015-11-10 | Discharge: 2015-11-10 | Disposition: A | Discharge status: Home / Self Care | Payer: Medicaid Other | Source: Ambulatory Visit | Attending: Family Medicine | Admitting: Family Medicine

## 2015-11-10 DIAGNOSIS — R1030 Lower abdominal pain, unspecified: Secondary | ICD-10-CM | POA: Diagnosis not present

## 2015-11-10 DIAGNOSIS — R102 Pelvic and perineal pain: Secondary | ICD-10-CM | POA: Insufficient documentation

## 2017-08-05 ENCOUNTER — Encounter: Payer: Self-pay | Admitting: Cardiology

## 2017-08-05 ENCOUNTER — Ambulatory Visit (INDEPENDENT_AMBULATORY_CARE_PROVIDER_SITE_OTHER): Payer: 59 | Admitting: Cardiology

## 2017-08-05 ENCOUNTER — Encounter: Payer: Self-pay | Admitting: *Deleted

## 2017-08-05 ENCOUNTER — Other Ambulatory Visit: Payer: Self-pay | Admitting: Cardiology

## 2017-08-05 VITALS — BP 128/84 | HR 61 | Ht 66.5 in | Wt 165.2 lb

## 2017-08-05 DIAGNOSIS — I493 Ventricular premature depolarization: Secondary | ICD-10-CM | POA: Diagnosis not present

## 2017-08-05 NOTE — Progress Notes (Signed)
Electrophysiology Office Note   Date:  08/05/2017   ID:  Ian Eaglesicholas Guthrie, DOB 02/02/1986, MRN [id number]  PCP:  [place], Betty G, MD  Cardiologist:  Rosemary HolmsPatwardhan Primary Electrophysiologist:  Dakin Madani Jorja LoaMartin Antwian Santaana, MD    Chief Complaint  Patient presents with  . Advice Only     History of Present Illness: Ian Stevens is a [age] y.o. male who is being seen today for the evaluation of PVCs at the request of [place], Timoteo ExposeBetty G, MD. Presenting today for electrophysiology evaluation.  He has a history of palpitations, was found to have PVCs.  Or a 24-hour monitor with 17,000 PVCs, up to 28%.  He was started on metoprolol, though has been feeling tired and fatigued, also sad and depressed at times.  Wondering about side effects of the medication.  Per the reading cardiologist, his ejection fraction was 45-50%, though though he does not feel like there is a significant cardiomyopathy, only that systolic function is mildly reduced because of frequent PVCs.  Today, he denies symptoms of shortness of breath, orthopnea, PND, lower extremity edema, claudication, dizziness, presyncope, syncope, bleeding, or neurologic sequela. The patient is tolerating medications without difficulties.    Past Medical History:  Diagnosis Date  . 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 GERD (gastroesophageal reflux disease)    no current med.  . Impingement syndrome of left shoulder 11/2014  . Instability of left shoulder joint 11/18/2014  . Seasonal asthma    states has not used inhaler in years   Past Surgical History:  Procedure Laterality Date  . MASS EXCISION  11/03/2009   multiple masses forehead, left eyebrow, abd., left thumb  . SHOULDER ARTHROSCOPY WITH SUBACROMIAL DECOMPRESSION Left 11/18/2014   Procedure: LEFT SHOULDER ARTHROSCOPY, ACROMIOPLASTY, DEBRIDEMENT OF LABRAL TEAR, CAPSULORRHAPHY;  Surgeon: Teryl LucyJoshua Landau, MD;  Location: [facility];  Service:  Orthopedics;  Laterality: Left;  . THYROGLOSSAL DUCT CYST    . TONSILLECTOMY AND ADENOIDECTOMY    . WISDOM TOOTH EXTRACTION       Current Outpatient Medications  Medication Sig Dispense Refill  . albuterol (PROVENTIL HFA;VENTOLIN HFA) 108 (90 Base) MCG/ACT inhaler Inhale 1 puff into the lungs as needed for wheezing or shortness of breath.    . diltiazem (CARDIZEM CD) 120 MG 24 hr capsule Take 120 mg by mouth daily.  1  . fexofenadine (ALLEGRA) 30 MG tablet Take 30 mg by mouth daily as needed.    . pantoprazole (PROTONIX) 20 MG tablet Take 20 mg by mouth daily.  2   No current facility-administered medications for this visit.     Allergies:   Patient has no known allergies.   Social History:  The patient  reports that  has never smoked. he has never used smokeless tobacco. He reports that he does not drink alcohol or use drugs.   Family History:  The patient's family history includes Alzheimer's disease in his maternal grandmother; Cancer in his mother; Dementia in his maternal grandfather and maternal grandmother; Heart disease in his maternal grandfather and maternal grandmother; Hyperlipidemia in his maternal grandfather, maternal grandmother, and mother; Hypertension in his maternal grandfather, maternal grandmother, and mother; Parkinson's disease in his paternal grandfather; Stroke in his maternal grandfather; Thyroid disease in his father.    ROS:  Please see the history of present illness.   Otherwise, review of systems is positive for fatigue, chest pressure, palpitations, cough, wheezing, anxiety.   All other systems are reviewed and negative.  PHYSICAL EXAM: VS:  BP 128/84   Pulse 61   Ht 5' 6.5" (1.689 m)   Wt 165 lb 3.2 oz (74.9 kg)   BMI 26.26 kg/m  , BMI Body mass index is 26.26 kg/m. GEN: Well nourished, well developed, in no acute distress  HEENT: normal  Neck: no JVD, carotid bruits, or masses Cardiac: iRRR; no murmurs, rubs, or gallops,no edema  Respiratory:   clear to auscultation bilaterally, normal work of breathing GI: soft, nontender, nondistended, + BS MS: no deformity or atrophy  Skin: warm and dry Neuro:  Strength and sensation are intact Psych: euthymic mood, full affect  EKG:  EKG is ordered today. Personal review of the ekg ordered shows sinus rhythm, rate 61  Device interrogation is reviewed today in detail.  See PaceArt for details.   Recent Labs: No results found for requested labs within last 8760 hours.    Lipid Panel  No results found for: CHOL, TRIG, HDL, CHOLHDL, VLDL, LDLCALC, LDLDIRECT   Wt Readings from Last 3 Encounters:  08/05/17 165 lb 3.2 oz (74.9 kg)  11/18/14 164 lb (74.4 kg)      Other studies Reviewed: Additional studies/ records that were reviewed today include: Holter monitor 06/27/17 Review of the above records today demonstrates: 1 minimum heart rate 38, maximum heart rate 125 27,354 PVCs  TTE 07/02/17 Mild decreased LV systolic function.  EF 45-50%.   ASSESSMENT AND PLAN:  1.  PVCs: 28% burden on his most recent Holter monitor.  Ejection fraction 45%.  I discussed with him further therapy for PVCs with flecainide versus ablation.  Risks and benefits of ablation were discussed.  Risks include bleeding, tamponade, heart block, stroke, and death among others.  He understands these risks and is agreed to the procedure.    Current medicines are reviewed at length with the patient today.   The patient does not have concerns regarding his medicines.  The following changes were made today:  none  Labs/ tests ordered today include:  Orders Placed This Encounter  Procedures  . EKG 12-Lead     Disposition:   FU with March Steyer 2 months  Signed, Torrence Hammack Jorja LoaMartin Neenah Canter, MD  08/05/2017 2:11 PM     [facility] [address] [place] [address] [phone] (office) [phone] (fax)

## 2017-08-05 NOTE — Patient Instructions (Addendum)
Medication Instructions:  Your physician recommends that you continue on your current medications as directed. Please refer to the Current Medication list given to you today.  * If you need a refill on your cardiac medications before your next appointment, please call your pharmacy. *  Labwork: None ordered  Testing/Procedures: Your physician has recommended that you have an ablation. Catheter ablation is a medical procedure used to treat some cardiac arrhythmias (irregular heartbeats). During catheter ablation, a long, thin, flexible tube is put into a blood vessel in your groin (upper thigh), or neck. This tube is called an ablation catheter. It is then guided to your heart through the blood vessel. Radio frequency waves destroy small areas of heart tissue where abnormal heartbeats may cause an arrhythmia to start. Please see the instruction sheet given to you today.  Follow-Up: Your physician recommends that you schedule a follow-up appointment between: January 4th - 16th with Dr. Elberta Fortisamnitz (for history &physical and pre procedure lab work)  Your physician recommends that you schedule a follow-up appointment in: 4 weeks, after your procedure on 10/03/17, with Dr. Elberta Fortisamnitz.   Thank you for choosing [facility]!!   Dory HornSherri Cortavius Montesinos, RN [phone]  Any Other Special Instructions Will Be Listed Below (If Applicable).     Cardiac Ablation Cardiac ablation is a procedure to disable (ablate) a small amount of heart tissue in very specific places. The heart has many electrical connections. Sometimes these connections are abnormal and can cause the heart to beat very fast or irregularly. Ablating some of the problem areas can improve the heart rhythm or return it to normal. Ablation may be done for people who:  Have Wolff-Parkinson-White syndrome.  Have fast heart rhythms (tachycardia).  Have taken medicines for an abnormal heart rhythm (arrhythmia) that were not effective or caused side  effects.  Have a high-risk heartbeat that may be life-threatening.  During the procedure, a small incision is made in the neck or the groin, and a long, thin, flexible tube (catheter) is inserted into the incision and moved to the heart. Small devices (electrodes) on the tip of the catheter will send out electrical currents. A type of X-ray (fluoroscopy) will be used to help guide the catheter and to provide images of the heart. Tell a health care provider about:  Any allergies you have.  All medicines you are taking, including vitamins, herbs, eye drops, creams, and over-the-counter medicines.  Any problems you or family members have had with anesthetic medicines.  Any blood disorders you have.  Any surgeries you have had.  Any medical conditions you have, such as kidney failure.  Whether you are pregnant or may be pregnant. What are the risks? Generally, this is a safe procedure. However, problems may occur, including:  Infection.  Bruising and bleeding at the catheter insertion site.  Bleeding into the chest, especially into the sac that surrounds the heart. This is a serious complication.  Stroke or blood clots.  Damage to other structures or organs.  Allergic reaction to medicines or dyes.  Need for a permanent pacemaker if the normal electrical system is damaged. A pacemaker is a small computer that sends electrical signals to the heart and helps your heart beat normally.  The procedure not being fully effective. This may not be recognized until months later. Repeat ablation procedures are sometimes required.  What happens before the procedure?  Follow instructions from your health care provider about eating or drinking restrictions.  Ask your health care provider about: ?  Changing or stopping your regular medicines. This is especially important if you are taking diabetes medicines or blood thinners. ? Taking medicines such as aspirin and ibuprofen. These medicines  can thin your blood. Do not take these medicines before your procedure if your health care provider instructs you not to.  Plan to have someone take you home from the hospital or clinic.  If you will be going home right after the procedure, plan to have someone with you for 24 hours. What happens during the procedure?  To lower your risk of infection: ? Your health care team will wash or sanitize their hands. ? Your skin will be washed with soap. ? Hair may be removed from the incision area.  An IV tube will be inserted into one of your veins.  You will be given a medicine to help you relax (sedative).  The skin on your neck or groin will be numbed.  An incision will be made in your neck or your groin.  A needle will be inserted through the incision and into a large vein in your neck or groin.  A catheter will be inserted into the needle and moved to your heart.  Dye may be injected through the catheter to help your surgeon see the area of the heart that needs treatment.  Electrical currents will be sent from the catheter to ablate heart tissue in desired areas. There are three types of energy that may be used to ablate heart tissue: ? Heat (radiofrequency energy). ? Laser energy. ? Extreme cold (cryoablation).  When the necessary tissue has been ablated, the catheter will be removed.  Pressure will be held on the catheter insertion area to prevent excessive bleeding.  A bandage (dressing) will be placed over the catheter insertion area. The procedure may vary among health care providers and hospitals. What happens after the procedure?  Your blood pressure, heart rate, breathing rate, and blood oxygen level will be monitored until the medicines you were given have worn off.  Your catheter insertion area will be monitored for bleeding. You will need to lie still for a few hours to ensure that you do not bleed from the catheter insertion area.  Do not drive for 24 hours or as  long as directed by your health care provider. Summary  Cardiac ablation is a procedure to disable (ablate) a small amount of heart tissue in very specific places. Ablating some of the problem areas can improve the heart rhythm or return it to normal.  During the procedure, electrical currents will be sent from the catheter to ablate heart tissue in desired areas. This information is not intended to replace advice given to you by your health care provider. Make sure you discuss any questions you have with your health care provider. Document Released: 01/19/2009 Document Revised: 07/22/2016 Document Reviewed: 07/22/2016 Elsevier Interactive Patient Education  [employer]2018 [employer] Inc[employer]

## 2017-09-10 DIAGNOSIS — J45998 Other asthma: Secondary | ICD-10-CM | POA: Insufficient documentation

## 2017-09-10 DIAGNOSIS — K219 Gastro-esophageal reflux disease without esophagitis: Secondary | ICD-10-CM | POA: Insufficient documentation

## 2017-09-25 ENCOUNTER — Encounter: Payer: Self-pay | Admitting: Cardiology

## 2017-09-25 ENCOUNTER — Ambulatory Visit (INDEPENDENT_AMBULATORY_CARE_PROVIDER_SITE_OTHER): Payer: 59 | Admitting: Cardiology

## 2017-09-25 VITALS — BP 110/80 | HR 56 | Ht 66.5 in | Wt 164.0 lb

## 2017-09-25 DIAGNOSIS — I493 Ventricular premature depolarization: Secondary | ICD-10-CM

## 2017-09-25 DIAGNOSIS — Z01812 Encounter for preprocedural laboratory examination: Secondary | ICD-10-CM

## 2017-09-25 NOTE — Progress Notes (Signed)
Electrophysiology Office Note   Date:  09/25/2017   ID:  Ian Stevens, DOB 1986/04/29, MRN [id number]  PCP:  [place], Betty G, MD  Cardiologist:  Rosemary Holms Primary Electrophysiologist:  Deakon Frix Jorja Loa, MD    Chief Complaint  Patient presents with  . Follow-up    PVC's     History of Present Illness: Ian Stevens is a 32 y.o. male who is being seen today for the evaluation of PVCs at the request of [place], Timoteo Expose, MD. Presenting today for electrophysiology evaluation.  He has a history of palpitations, was found to have PVCs.  Or a 24-hour monitor with 17,000 PVCs, up to 28%.  He was started on metoprolol, though has been feeling tired and fatigued, also sad and depressed at times.  Wondering about side effects of the medication.  Per the reading cardiologist, his ejection fraction was 45-50%, though though he does not feel like there is a significant cardiomyopathy, only that systolic function is mildly reduced due to of frequent PVCs.  For PVC ablation on 10/03/17.  Today, denies symptoms of palpitations, chest pain, shortness of breath, orthopnea, PND, lower extremity edema, claudication, dizziness, presyncope, syncope, bleeding, or neurologic sequela. The patient is tolerating medications without difficulties.  He needs to have episodes of fatigue.  He feels that this is likely due to his PVCs.  He is not acutely aware of palpitations.   Past Medical History:  Diagnosis Date  . 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 GERD (gastroesophageal reflux disease)    no current med.  . Impingement syndrome of left shoulder 11/2014  . Instability of left shoulder joint 11/18/2014  . Seasonal asthma    states has not used inhaler in years   Past Surgical History:  Procedure Laterality Date  . MASS EXCISION  11/03/2009   multiple masses forehead, left eyebrow, abd., left thumb  . SHOULDER ARTHROSCOPY WITH SUBACROMIAL DECOMPRESSION Left 11/18/2014     Procedure: LEFT SHOULDER ARTHROSCOPY, ACROMIOPLASTY, DEBRIDEMENT OF LABRAL TEAR, CAPSULORRHAPHY;  Surgeon: Teryl Lucy, MD;  [facility];  Service: Orthopedics;  Laterality: Left;  . THYROGLOSSAL DUCT CYST    . TONSILLECTOMY AND ADENOIDECTOMY    . WISDOM TOOTH EXTRACTION       Current Outpatient Medications  Medication Sig Dispense Refill  . albuterol (PROVENTIL HFA;VENTOLIN HFA) 108 (90 Base) MCG/ACT inhaler Inhale 2 puffs into the lungs every 6 (six) hours as needed for wheezing or shortness of breath.     . diltiazem (CARDIZEM CD) 120 MG 24 hr capsule Take 120 mg by mouth daily.  1  . pantoprazole (PROTONIX) 20 MG tablet Take 20 mg by mouth daily.  2   No current facility-administered medications for this visit.     Allergies:   Patient has no known allergies.   Social History:  The patient  reports that  has never smoked. he has never used smokeless tobacco. He reports that he does not drink alcohol or use drugs.   Family History:  The patient's family history includes Alzheimer's disease in his maternal grandmother; Cancer in his mother; Dementia in his maternal grandfather and maternal grandmother; Heart disease in his maternal grandfather and maternal grandmother; Hyperlipidemia in his maternal grandfather, maternal grandmother, and mother; Hypertension in his maternal grandfather, maternal grandmother, and mother; Parkinson's disease in his paternal grandfather; Stroke in his maternal grandfather; Thyroid disease in his father.    ROS:  Please see the history of present illness.  Otherwise, review of systems is positive for palpitations, fatigue, chest pain, diarrhea, muscle pain, anxiety.   All other systems are reviewed and negative.   PHYSICAL EXAM: VS:  BP 110/80   Pulse (!) 56   Ht 5' 6.5" (1.689 m)   Wt 164 lb (74.4 kg)   SpO2 98%   BMI 26.07 kg/m  , BMI Body mass index is 26.07 kg/m. GEN: Well nourished, well developed, in no acute  distress  HEENT: normal  Neck: no JVD, carotid bruits, or masses Cardiac: iRRR; no murmurs, rubs, or gallops,no edema  Respiratory:  clear to auscultation bilaterally, normal work of breathing GI: soft, nontender, nondistended, + BS MS: no deformity or atrophy  Skin: warm and dry Neuro:  Strength and sensation are intact Psych: euthymic mood, full affect  EKG:  EKG is not ordered today. Personal review of the ekg ordered 08/05/17 shows SR, rate 61   Recent Labs: No results found for requested labs within last 8760 hours.    Lipid Panel  No results found for: CHOL, TRIG, HDL, CHOLHDL, VLDL, LDLCALC, LDLDIRECT   Wt Readings from Last 3 Encounters:  09/25/17 164 lb (74.4 kg)  08/05/17 165 lb 3.2 oz (74.9 kg)  11/18/14 164 lb (74.4 kg)      Other studies Reviewed: Additional studies/ records that were reviewed today include: Holter monitor 06/27/17 Review of the above records today demonstrates: 1 minimum heart rate 38, maximum heart rate 125  27,354 PVCs  TTE 07/02/17 Mild decreased LV systolic function.  EF 45-50%.   ASSESSMENT AND PLAN:  1.  PVCs: 28% PVCs seen on the monitor.  Plan for ablation on 08/03/18.  Risks and benefits discussed.  Risks include bleeding, tamponade, heart block, and stroke among others.  The patient understands these risks and is agreed to the procedure.      Current medicines are reviewed at length with the patient today.   The patient does not have concerns regarding his medicines.  The following changes were made today:  none  Labs/ tests ordered today include:  Orders Placed This Encounter  Procedures  . Basic Metabolic Panel (BMET)  . CBC w/Diff     Disposition:   FU with Dianey Suchy 1 months  Signed, Jaylon Boylen Jorja LoaMartin Tamey Wanek, MD  09/25/2017 4:37 PM     [facility] [address] [place] [address] [phone] (office) [phone] (fax)

## 2017-09-25 NOTE — Patient Instructions (Addendum)
Medication Instructions:  Your physician recommends that you continue on your current medications as directed. Please refer to the Current Medication list given to you today.  * If you need a refill on your cardiac medications before your next appointment, please call your pharmacy. *  Labwork: Pre procedure labs today: BMET & CBC w/ diff  Testing/Procedures: None ordered  Follow-Up: Keep your scheduled follow up with Dr. Elberta Fortisamnitz in February  Thank you for choosing [facility]!!   Dory HornSherri Yale Golla, RN [phone]

## 2017-09-26 LAB — CBC WITH DIFFERENTIAL/PLATELET
BASOS ABS: 0 10*3/uL (ref 0.0–0.2)
BASOS: 1 %
EOS (ABSOLUTE): 0.4 10*3/uL (ref 0.0–0.4)
EOS: 6 %
Hematocrit: 44.8 % (ref 37.5–51.0)
Hemoglobin: 15.5 g/dL (ref 13.0–17.7)
IMMATURE GRANS (ABS): 0 10*3/uL (ref 0.0–0.1)
Immature Granulocytes: 0 %
Lymphocytes Absolute: 2.1 10*3/uL (ref 0.7–3.1)
Lymphs: 35 %
MCH: 29.2 pg (ref 26.6–33.0)
MCHC: 34.6 g/dL (ref 31.5–35.7)
MCV: 85 fL (ref 79–97)
MONOCYTES: 11 %
Monocytes Absolute: 0.7 10*3/uL (ref 0.1–0.9)
NEUTROS ABS: 2.8 10*3/uL (ref 1.4–7.0)
Neutrophils: 47 %
Platelets: 239 10*3/uL (ref 150–379)
RBC: 5.3 x10E6/uL (ref 4.14–5.80)
RDW: 13.6 % (ref 12.3–15.4)
WBC: 5.9 10*3/uL (ref 3.4–10.8)

## 2017-09-26 LAB — BASIC METABOLIC PANEL
BUN/Creatinine Ratio: 12 (ref 9–20)
BUN: 13 mg/dL (ref 6–20)
CHLORIDE: 100 mmol/L (ref 96–106)
CO2: 28 mmol/L (ref 20–29)
Calcium: 9.7 mg/dL (ref 8.7–10.2)
Creatinine, Ser: 1.08 mg/dL (ref 0.76–1.27)
GFR, EST AFRICAN AMERICAN: 105 mL/min/{1.73_m2} (ref >59)
GFR, EST NON AFRICAN AMERICAN: 91 mL/min/{1.73_m2} (ref >59)
Glucose: 116 mg/dL — ABNORMAL HIGH (ref 65–99)
Potassium: 4.2 mmol/L (ref 3.5–5.2)
Sodium: 142 mmol/L (ref 134–144)

## 2017-10-03 ENCOUNTER — Ambulatory Visit (HOSPITAL_COMMUNITY)
Admission: RE | Admit: 2017-10-03 | Discharge: 2017-10-03 | Disposition: A | Discharge status: Home / Self Care | Payer: 59 | Source: Ambulatory Visit | Attending: Cardiology | Admitting: Cardiology

## 2017-10-03 ENCOUNTER — Encounter (HOSPITAL_COMMUNITY)
Admission: RE | Disposition: A | Discharge status: Home / Self Care | Payer: Self-pay | Source: Ambulatory Visit | Attending: Cardiology

## 2017-10-03 ENCOUNTER — Ambulatory Visit (HOSPITAL_COMMUNITY): Payer: 59 | Admitting: Anesthesiology

## 2017-10-03 ENCOUNTER — Encounter (HOSPITAL_COMMUNITY): Payer: Self-pay | Admitting: Certified Registered Nurse Anesthetist

## 2017-10-03 DIAGNOSIS — Z79899 Other long term (current) drug therapy: Secondary | ICD-10-CM | POA: Diagnosis not present

## 2017-10-03 DIAGNOSIS — I493 Ventricular premature depolarization: Secondary | ICD-10-CM | POA: Diagnosis not present

## 2017-10-03 DIAGNOSIS — J45909 Unspecified asthma, uncomplicated: Secondary | ICD-10-CM | POA: Diagnosis not present

## 2017-10-03 DIAGNOSIS — K219 Gastro-esophageal reflux disease without esophagitis: Secondary | ICD-10-CM | POA: Diagnosis not present

## 2017-10-03 HISTORY — PX: PVC ABLATION: EP1236

## 2017-10-03 SURGERY — PVC ABLATION
Anesthesia: General

## 2017-10-03 MED ORDER — OXYCODONE HCL 5 MG PO TABS
ORAL_TABLET | ORAL | Status: AC
  Filled 2017-10-03: qty 1

## 2017-10-03 MED ORDER — HEPARIN SODIUM (PORCINE) 1000 UNIT/ML IJ SOLN
INTRAMUSCULAR | Status: AC
  Filled 2017-10-03: qty 1

## 2017-10-03 MED ORDER — BUPIVACAINE HCL (PF) 0.25 % IJ SOLN
INTRAMUSCULAR | Status: DC | PRN
  Administered 2017-10-03: 30 mL

## 2017-10-03 MED ORDER — SODIUM CHLORIDE 0.9 % IV SOLN: 250.0000 mL | INTRAVENOUS | Status: DC | PRN

## 2017-10-03 MED ORDER — OXYCODONE HCL 5 MG PO TABS
5.0000 mg | ORAL_TABLET | Freq: Once | ORAL | Status: AC
  Administered 2017-10-03: 5 mg via ORAL

## 2017-10-03 MED ORDER — ONDANSETRON HCL 4 MG/2ML IJ SOLN: 4.0000 mg | Freq: Four times a day (QID) | INTRAMUSCULAR | Status: DC | PRN

## 2017-10-03 MED ORDER — LIDOCAINE HCL (CARDIAC) 20 MG/ML IV SOLN
INTRAVENOUS | Status: DC | PRN
  Administered 2017-10-03: 40 mg via INTRAVENOUS

## 2017-10-03 MED ORDER — ONDANSETRON HCL 4 MG/2ML IJ SOLN
INTRAMUSCULAR | Status: DC | PRN
  Administered 2017-10-03: 4 mg via INTRAVENOUS

## 2017-10-03 MED ORDER — HEPARIN (PORCINE) IN NACL 2-0.9 UNIT/ML-% IJ SOLN
INTRAMUSCULAR | Status: AC
  Filled 2017-10-03: qty 500

## 2017-10-03 MED ORDER — BUPIVACAINE HCL (PF) 0.25 % IJ SOLN
INTRAMUSCULAR | Status: AC
  Filled 2017-10-03: qty 30

## 2017-10-03 MED ORDER — MIDAZOLAM HCL 2 MG/2ML IJ SOLN
INTRAMUSCULAR | Status: DC | PRN
  Administered 2017-10-03: 2 mg via INTRAVENOUS

## 2017-10-03 MED ORDER — PROPOFOL 500 MG/50ML IV EMUL
INTRAVENOUS | Status: DC | PRN
  Administered 2017-10-03: 75 ug/kg/min via INTRAVENOUS

## 2017-10-03 MED ORDER — FENTANYL CITRATE (PF) 100 MCG/2ML IJ SOLN
INTRAMUSCULAR | Status: DC | PRN
  Administered 2017-10-03: 50 ug via INTRAVENOUS

## 2017-10-03 MED ORDER — HEPARIN SODIUM (PORCINE) 1000 UNIT/ML IJ SOLN
INTRAMUSCULAR | Status: DC | PRN
  Administered 2017-10-03: 1000 [IU] via INTRAVENOUS

## 2017-10-03 MED ORDER — SODIUM CHLORIDE 0.9 % IV SOLN
INTRAVENOUS | Status: DC | PRN
  Administered 2017-10-03: 07:00:00 via INTRAVENOUS

## 2017-10-03 MED ORDER — SODIUM CHLORIDE 0.9% FLUSH: 3.0000 mL | INTRAVENOUS | Status: DC | PRN

## 2017-10-03 MED ORDER — ACETAMINOPHEN 325 MG PO TABS
650.0000 mg | ORAL_TABLET | ORAL | Status: DC | PRN
  Administered 2017-10-03: 650 mg via ORAL
  Filled 2017-10-03: qty 2

## 2017-10-03 MED ORDER — ACETAMINOPHEN 325 MG PO TABS
ORAL_TABLET | ORAL | Status: AC
  Administered 2017-10-03: 650 mg via ORAL
  Filled 2017-10-03: qty 2

## 2017-10-03 MED ORDER — HEPARIN (PORCINE) IN NACL 2-0.9 UNIT/ML-% IJ SOLN
INTRAMUSCULAR | Status: AC | PRN
  Administered 2017-10-03: 1000 mL

## 2017-10-03 MED ORDER — PROPOFOL 10 MG/ML IV BOLUS
INTRAVENOUS | Status: DC | PRN
  Administered 2017-10-03: 20 mg via INTRAVENOUS

## 2017-10-03 MED ORDER — SODIUM CHLORIDE 0.9% FLUSH: 3.0000 mL | Freq: Two times a day (BID) | INTRAVENOUS | Status: DC

## 2017-10-03 SURGICAL SUPPLY — 16 items
BAG SNAP BAND KOVER 36X36 (MISCELLANEOUS) ×2 IMPLANT
BLANKET WARM UNDERBOD FULL ACC (MISCELLANEOUS) ×2 IMPLANT
CATH JOSEPHSON QUAD-ALLRED 6FR (CATHETERS) ×2 IMPLANT
CATH SMTCH THERMOCOOL SF DF (CATHETERS) ×2 IMPLANT
CATH SOUNDSTAR ECO REPROCESSED (CATHETERS) ×2 IMPLANT
CATH WEBSTER BI DIR CS D-F CRV (CATHETERS) ×2 IMPLANT
COVER SWIFTLINK CONNECTOR (BAG) ×2 IMPLANT
PACK EP LATEX FREE (CUSTOM PROCEDURE TRAY) ×1
PACK EP LF (CUSTOM PROCEDURE TRAY) ×1 IMPLANT
PAD DEFIB LIFELINK (PAD) ×2 IMPLANT
PATCH CARTO3 (PAD) ×2 IMPLANT
SHEATH AVANTI 11F 11CM (SHEATH) ×2 IMPLANT
SHEATH PINNACLE 6F 10CM (SHEATH) ×2 IMPLANT
SHEATH PINNACLE 7F 10CM (SHEATH) ×2 IMPLANT
SHEATH PINNACLE 8F 10CM (SHEATH) ×2 IMPLANT
TUBING SMART ABLATE COOLFLOW (TUBING) ×2 IMPLANT

## 2017-10-03 NOTE — Anesthesia Postprocedure Evaluation (Signed)
Anesthesia Post Note  Patient: Ian Stevens  Procedure(s) Performed: PVC ABLATION (N/A )     Patient location during evaluation: Cath Lab Anesthesia Type: General Level of consciousness: awake and alert Pain management: pain level controlled Vital Signs Assessment: post-procedure vital signs reviewed and stable Respiratory status: spontaneous breathing, nonlabored ventilation, respiratory function stable and patient connected to nasal cannula oxygen Cardiovascular status: stable and blood pressure returned to baseline Postop Assessment: no apparent nausea or vomiting Anesthetic complications: no    Last Vitals:  Vitals:   10/03/17 1412 10/03/17 1415  BP:    Pulse: 67 61  Resp: 20 17  Temp:    SpO2: 98% 99%    Last Pain:  Vitals:   10/03/17 1415  TempSrc:   PainSc: 3                  Sagar Tengan,JAMES TERRILL

## 2017-10-03 NOTE — Progress Notes (Signed)
The patient is seen post procedure (Patient has already been seen post procedure by Dr. Elberta Fortisamnitz) He feels well, has no complaints, no site discomfort, no CP, palpitations or SOB, lumbar back pain is resolved SR on telemetry, VSS B/l groin sites are stable, soft, non-tender, no bleeding or hematomas Site care and activity restrictions were discussed with the patient No change to home medicines  Planned for discharge to home once bedrest is completed, after ambulation if patient and procedure sites remain stable  Francis Dowseenee Jamee Pacholski, PA-C

## 2017-10-03 NOTE — Progress Notes (Signed)
Site area: RFA x 2   LFA x 2 Site Prior to Removal:  Level 0 / 0 Pressure Applied For:25 min right   15 min left Manual:   yes Patient Status During Pull:  stable Post Pull Site:  Level 0 / 0 Post Pull Instructions Given:  yes Post Pull Pulses Present: palpable Dressing Applied:  tegaderm Bedrest begins @ 1110 till 1710 Comments:

## 2017-10-03 NOTE — Progress Notes (Signed)
Site area:  Site Prior to Removal:  Level  Pressure Applied For: Manual:   yes Patient Status During Pull:   Post Pull Site:  Level Post Pull Instructions Given: yes  Post Pull Pulses Present: palpable Dressing Applied:  tegaderm Bedrest begins @  Comments:

## 2017-10-03 NOTE — Anesthesia Preprocedure Evaluation (Addendum)
Anesthesia Evaluation  Patient identified by MRN, date of birth, ID band Patient awake    Reviewed: Allergy & Precautions, NPO status , Patient's Chart, lab work & pertinent test results  Airway Mallampati: II  TM Distance: >3 FB Neck ROM: Full    Dental  (+) Teeth Intact   Pulmonary asthma ,    breath sounds clear to auscultation       Cardiovascular + dysrhythmias  Rhythm:Irregular Rate:Normal  Hx of frequent PVCs   Neuro/Psych    GI/Hepatic Neg liver ROS, GERD  ,  Endo/Other  negative endocrine ROS  Renal/GU negative Renal ROS     Musculoskeletal   Abdominal   Peds  Hematology negative hematology ROS (+)   Anesthesia Other Findings   Reproductive/Obstetrics                           Anesthesia Physical Anesthesia Plan  ASA: II  Anesthesia Plan: General   Post-op Pain Management:    Induction: Intravenous  PONV Risk Score and Plan: 2 and Treatment may vary due to age or medical condition, Dexamethasone and Ondansetron  Airway Management Planned: LMA  Additional Equipment:   Intra-op Plan:   Post-operative Plan: Extubation in OR  Informed Consent: I have reviewed the patients History and Physical, chart, labs and discussed the procedure including the risks, benefits and alternatives for the proposed anesthesia with the patient or authorized representative who has indicated his/her understanding and acceptance.   Dental advisory given  Plan Discussed with: CRNA  Anesthesia Plan Comments:         Anesthesia Quick Evaluation

## 2017-10-03 NOTE — H&P (Signed)
Ian Stevens has presented today for surgery, with the diagnosis of PVCs.  The various methods of treatment have been discussed with the patient and family. After consideration of risks, benefits and other options for treatment, the patient has consented to  Procedure(s): PVC ablation as a surgical intervention .  Risks include but not limited to bleeding, tamponade, heart block, stroke, damage to surrounding organs, among others. The patient's history has been reviewed, patient examined, no change in status, stable for surgery.  I have reviewed the patient's chart and labs.  Questions were answered to the patient's satisfaction.    Will Elberta Fortisamnitz, MD 10/03/2017 7:14 AM

## 2017-10-03 NOTE — Discharge Instructions (Signed)
No driving for 4 days. No lifting over 5 lbs for 1 week. No vigorous or sexual activity for 1 week. You may return to work in one week. Keep procedure site clean & dry. If you notice increased pain, swelling, bleeding or pus, call/return!  You may shower, but no soaking baths/hot tubs/pools for 1 week.     Moderate Conscious Sedation, Adult, Care After These instructions provide you with information about caring for yourself after your procedure. Your health care provider may also give you more specific instructions. Your treatment has been planned according to current medical practices, but problems sometimes occur. Call your health care provider if you have any problems or questions after your procedure. What can I expect after the procedure? After your procedure, it is common:  To feel sleepy for several hours.  To feel clumsy and have poor balance for several hours.  To have poor judgment for several hours.  To vomit if you eat too soon.  Follow these instructions at home: For at least 24 hours after the procedure:   Do not: ? Participate in activities where you could fall or become injured. ? Drive. ? Use heavy machinery. ? Drink alcohol. ? Take sleeping pills or medicines that cause drowsiness. ? Make important decisions or sign legal documents. ? Take care of children on your own.  Rest. Eating and drinking  Follow the diet recommended by your health care provider.  If you vomit: ? Drink water, juice, or soup when you can drink without vomiting. ? Make sure you have little or no nausea before eating solid foods. General instructions  Have a responsible adult stay with you until you are awake and alert.  Take over-the-counter and prescription medicines only as told by your health care provider.  If you smoke, do not smoke without supervision.  Keep all follow-up visits as told by your health care provider. This is important. Contact a health care provider  if:  You keep feeling nauseous or you keep vomiting.  You feel light-headed.  You develop a rash.  You have a fever. Get help right away if:  You have trouble breathing. This information is not intended to replace advice given to you by your health care provider. Make sure you discuss any questions you have with your health care provider. Document Released: 06/23/2013 Document Revised: 02/05/2016 Document Reviewed: 12/23/2015 Elsevier Interactive Patient Education  2018 [employer] Cardiac Ablation, Care After This sheet gives you information about how to care for yourself after your procedure. Your health care provider may also give you more specific instructions. If you have problems or questions, contact your health care provider. What can I expect after the procedure? After the procedure, it is common to have:  Bruising around your puncture site.  Tenderness around your puncture site.  Skipped heartbeats.  Tiredness (fatigue).  Follow these instructions at home: Puncture site care  Follow instructions from your health care provider about how to take care of your puncture site. Make sure you: ? Wash your hands with soap and water before you change your bandage (dressing). If soap and water are not available, use hand sanitizer. ? Change your dressing as told by your health care provider. ? Leave stitches (sutures), skin glue, or adhesive strips in place. These skin closures may need to stay in place for up to 2 weeks. If adhesive strip edges start to loosen and curl up, you may trim the loose edges. Do not remove adhesive strips completely unless your  health care provider tells you to do that.  Check your puncture site every day for signs of infection. Check for: ? Redness, swelling, or pain. ? Fluid or blood. If your puncture site starts to bleed, lie down on your back, apply firm pressure to the area, and contact your health care provider. ? Warmth. ? Pus or a bad  smell. Driving  Ask your health care provider when it is safe for you to drive again after the procedure.  Do not drive or use heavy machinery while taking prescription pain medicine.  Do not drive for 24 hours if you were given a medicine to help you relax (sedative) during your procedure. Activity  Avoid activities that take a lot of effort for at least 3 days after your procedure.  Do not lift anything that is heavier than 10 lb (4.5 kg), or the limit that you are told, until your health care provider says that it is safe.  Return to your normal activities as told by your health care provider. Ask your health care provider what activities are safe for you. General instructions  Take over-the-counter and prescription medicines only as told by your health care provider.  Do not use any products that contain nicotine or tobacco, such as cigarettes and e-cigarettes. If you need help quitting, ask your health care provider.  Do not take baths, swim, or use a hot tub until your health care provider approves.  Do not drink alcohol for 24 hours after your procedure.  Keep all follow-up visits as told by your health care provider. This is important. Contact a health care provider if:  You have redness, mild swelling, or pain around your puncture site.  You have fluid or blood coming from your puncture site that stops after applying firm pressure to the area.  Your puncture site feels warm to the touch.  You have pus or a bad smell coming from your puncture site.  You have a fever.  You have chest pain or discomfort that spreads to your neck, jaw, or arm.  You are sweating a lot.  You feel nauseous.  You have a fast or irregular heartbeat.  You have shortness of breath.  You are dizzy or light-headed and feel the need to lie down.  You have pain or numbness in the arm or leg closest to your puncture site. Get help right away if:  Your puncture site suddenly  swells.  Your puncture site is bleeding and the bleeding does not stop after applying firm pressure to the area. These symptoms may represent a serious problem that is an emergency. Do not wait to see if the symptoms will go away. Get medical help right away. Call your local emergency services (911 in the [place] Do not drive yourself to the hospital. Summary  After the procedure, it is normal to have bruising and tenderness at the puncture site in your groin, neck, or forearm.  Check your puncture site every day for signs of infection.  Get help right away if your puncture site is bleeding and the bleeding does not stop after applying firm pressure to the area. This is a medical emergency. This information is not intended to replace advice given to you by your health care provider. Make sure you discuss any questions you have with your health care provider. Document Released: 12/12/2016 Document Revised: 12/12/2016 Document Reviewed: 12/12/2016 Elsevier Interactive Patient Education  2018 [employer].

## 2017-10-03 NOTE — Transfer of Care (Signed)
Immediate Anesthesia Transfer of Care Note  Patient: Ian Stevens  Procedure(s) Performed: PVC ABLATION (N/A )  Patient Location: PACU  Anesthesia Type:MAC  Level of Consciousness: awake, alert  and oriented  Airway & Oxygen Therapy: Patient Spontanous Breathing  Post-op Assessment: Report given to RN, Post -op Vital signs reviewed and stable and Patient moving all extremities X 4  Post vital signs: Reviewed and stable  Last Vitals:  Vitals:   10/03/17 0609  BP: (!) 134/97  Pulse: 65  Temp: 36.8 C  SpO2: 100%    Last Pain:  Vitals:   10/03/17 0609  TempSrc: Oral      Patients Stated Pain Goal: 3 (16/60/63 0160)  Complications: No apparent anesthesia complications

## 2017-10-06 ENCOUNTER — Encounter (HOSPITAL_COMMUNITY): Payer: Self-pay | Admitting: Cardiology

## 2017-11-02 NOTE — Progress Notes (Signed)
Electrophysiology Office Note   Date:  11/03/2017   ID:  Ian Stevens, DOB April 16, 1986, MRN [id number]  PCP:  System, Provider Not In  Cardiologist:  Patwardhan Primary Electrophysiologist:  Perpetua Elling Jorja Loa, MD    Chief Complaint  Patient presents with  . Follow-up    PVC's/Post ablation     History of Present Illness: Ian Stevens is a 32 y.o. male who is being seen today for the evaluation of PVCs at the request of [place], Timoteo Expose, MD. Presenting today for electrophysiology evaluation.  He has a history of palpitations, was found to have PVCs.  Or a 24-hour monitor with 17,000 PVCs, up to 28%.  He was started on metoprolol, though has been feeling tired and fatigued, also sad and depressed at times.  Wondering about side effects of the medication.  Per the reading cardiologist, his ejection fraction was 45-50%, though though he does not feel like there is a significant cardiomyopathy, only that systolic function is mildly reduced due to of frequent PVCs.  Had ablation 10/03/17.  Today, denies symptoms of palpitations, chest pain, shortness of breath, orthopnea, PND, lower extremity edema, claudication, dizziness, presyncope, syncope, bleeding, or neurologic sequela. The patient is tolerating medications without difficulties.  Since ablation he has done well.  He has noted no further episodes of palpitations.  He is able to do all of his daily activities without issue.   Past Medical History:  Diagnosis Date  . 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 GERD (gastroesophageal reflux disease)    no current med.  . Impingement syndrome of left shoulder 11/2014  . Instability of left shoulder joint 11/18/2014  . Seasonal asthma    states has not used inhaler in years   Past Surgical History:  Procedure Laterality Date  . MASS EXCISION  11/03/2009   multiple masses forehead, left eyebrow, abd., left thumb  . PVC ABLATION N/A 10/03/2017   Procedure: PVC ABLATION;  Surgeon: Regan Lemming, MD;  Location: [facility];  Service: Cardiovascular;  Laterality: N/A;  . SHOULDER ARTHROSCOPY WITH SUBACROMIAL DECOMPRESSION Left 11/18/2014   Procedure: LEFT SHOULDER ARTHROSCOPY, ACROMIOPLASTY, DEBRIDEMENT OF LABRAL TEAR, CAPSULORRHAPHY;  Surgeon: Teryl Lucy, MD;  Location: [facility];  Service: Orthopedics;  Laterality: Left;  . THYROGLOSSAL DUCT CYST    . TONSILLECTOMY AND ADENOIDECTOMY    . WISDOM TOOTH EXTRACTION       Current Outpatient Medications  Medication Sig Dispense Refill  . albuterol (PROVENTIL HFA;VENTOLIN HFA) 108 (90 Base) MCG/ACT inhaler Inhale 2 puffs into the lungs every 6 (six) hours as needed for wheezing or shortness of breath.     . diltiazem (CARDIZEM CD) 120 MG 24 hr capsule Take 120 mg by mouth daily.  1  . pantoprazole (PROTONIX) 20 MG tablet Take 20 mg by mouth daily.  2   No current facility-administered medications for this visit.     Allergies:   Patient has no known allergies.   Social History:  The patient  reports that  has never smoked. he has never used smokeless tobacco. He reports that he does not drink alcohol or use drugs.   Family History:  The patient's family history includes Alzheimer's disease in his maternal grandmother; Cancer in his mother; Dementia in his maternal grandfather and maternal grandmother; Heart disease in his maternal grandfather and maternal grandmother; Hyperlipidemia in his maternal grandfather, maternal grandmother, and mother; Hypertension in his maternal grandfather, maternal grandmother, and  mother; Parkinson's disease in his paternal grandfather; Stroke in his maternal grandfather; Thyroid disease in his father.   ROS:  Please see the history of present illness.   Otherwise, review of systems is positive for cough.   All other systems are reviewed and negative.   PHYSICAL EXAM: VS:  BP 120/88   Pulse 73   Ht 5\' 6"  (1.676 m)   Wt 165  lb (74.8 kg)   BMI 26.63 kg/m  , BMI Body mass index is 26.63 kg/m. GEN: Well nourished, well developed, in no acute distress  HEENT: normal  Neck: no JVD, carotid bruits, or masses Cardiac: RRR; no murmurs, rubs, or gallops,no edema  Respiratory:  clear to auscultation bilaterally, normal work of breathing GI: soft, nontender, nondistended, + BS MS: no deformity or atrophy  Skin: warm and dry Neuro:  Strength and sensation are intact Psych: euthymic mood, full affect  EKG:  EKG is ordered today. Personal review of the ekg ordered shows sinus rhythm, rate 73   Recent Labs: 09/25/2017: BUN 13; Creatinine, Ser 1.08; Hemoglobin 15.5; Platelets 239; Potassium 4.2; Sodium 142    Lipid Panel  No results found for: CHOL, TRIG, HDL, CHOLHDL, VLDL, LDLCALC, LDLDIRECT   Wt Readings from Last 3 Encounters:  11/03/17 165 lb (74.8 kg)  10/03/17 165 lb (74.8 kg)  09/25/17 164 lb (74.4 kg)      Other studies Reviewed: Additional studies/ records that were reviewed today include: Holter monitor 06/27/17 Review of the above records today demonstrates: 1 minimum heart rate 38, maximum heart rate 125  27,354 PVCs  TTE 07/02/17 Mild decreased LV systolic function.  EF 45-50%.   ASSESSMENT AND PLAN:  1.  PVCs: 28% PVCs seen on the monitor.  Had ablation 10/03/17.  No further episodes of palpitations.  EKG today is with out abnormalities in sinus rhythm.  We Yadier Bramhall plan to stop his diltiazem.    Current medicines are reviewed at length with the patient today.   The patient does not have concerns regarding his medicines.  The following changes were made today: Stop diltiazem  Labs/ tests ordered today include:  Orders Placed This Encounter  Procedures  . EKG 12-Lead     Disposition:   FU with Sontee Desena as needed  Signed, Vanessa Alesi Jorja LoaMartin Shakeia Krus, MD  11/03/2017 3:45 PM     [facility] [address] [place] [address] [phone]  (office) [phone] (fax)

## 2017-11-03 ENCOUNTER — Encounter: Payer: Self-pay | Admitting: Cardiology

## 2017-11-03 ENCOUNTER — Ambulatory Visit (INDEPENDENT_AMBULATORY_CARE_PROVIDER_SITE_OTHER): Payer: 59 | Admitting: Cardiology

## 2017-11-03 VITALS — BP 120/88 | HR 73 | Ht 66.0 in | Wt 165.0 lb

## 2017-11-03 DIAGNOSIS — I493 Ventricular premature depolarization: Secondary | ICD-10-CM | POA: Diagnosis not present

## 2017-11-03 NOTE — Patient Instructions (Signed)
Medication Instructions:  Your physician has recommended you make the following change in your medication:  1. STOP Diltiazem  * If you need a refill on your cardiac medications before your next appointment, please call your pharmacy.   Labwork: None ordered  Testing/Procedures: None ordered  Follow-Up: No follow up is needed at this time with Dr. Camnitz.  He will see you on an as needed basis.   Thank you for choosing [facility]!!   Morgon Pamer, RN [phone]       

## 2018-01-07 IMAGING — US US PELVIS LIMITED
1 series · 14 of 19 positions shown · non-contrast
Comparison: No prior.

CLINICAL DATA: Left inguinal canal/ pelvic fullness with coughing
or moving.

EXAM:
LIMITED ULTRASOUND OF PELVIS
TECHNIQUE: Limited transabdominal ultrasound examination of the pelvis was
performed.

[Series 1: us pelvis limited · 0.08mm/px · 19 acquisitions, 14 frames shown]
[im 1/19]
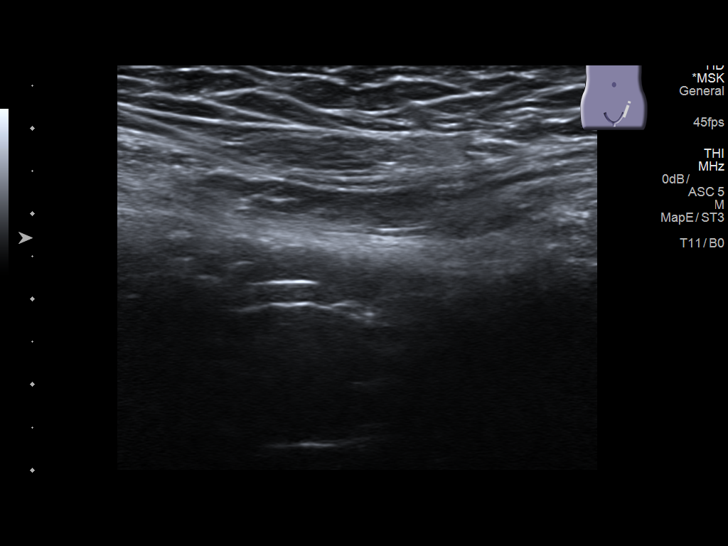
[im 3/19]
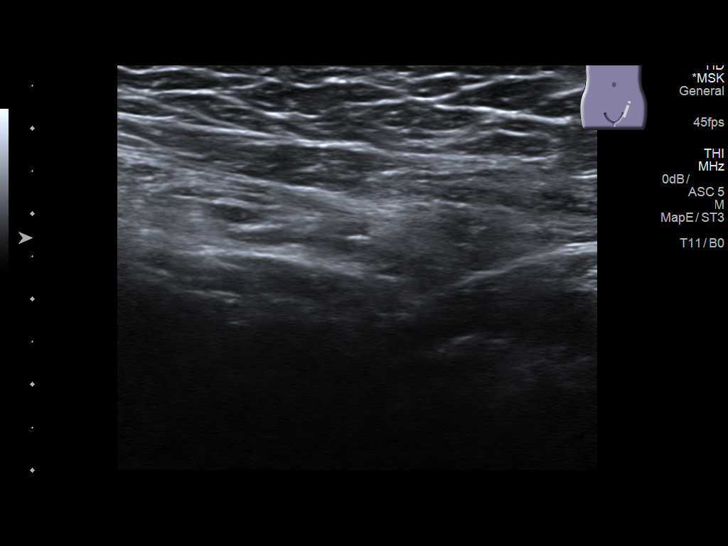
[im 4/19]
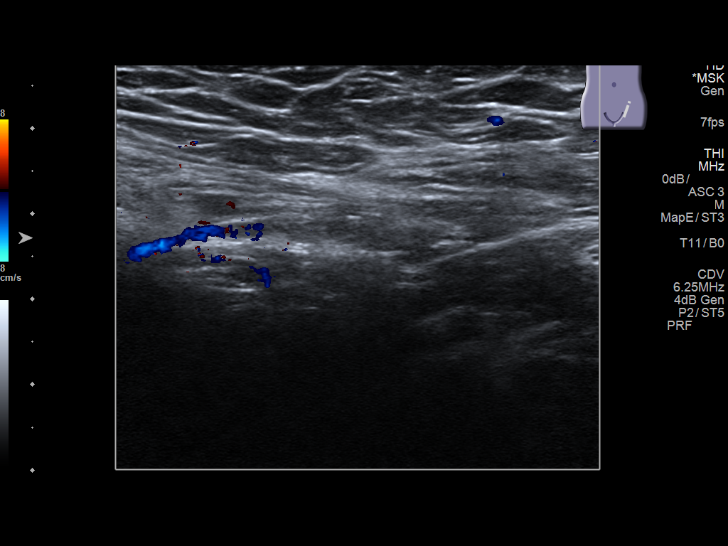
[im 5/19]
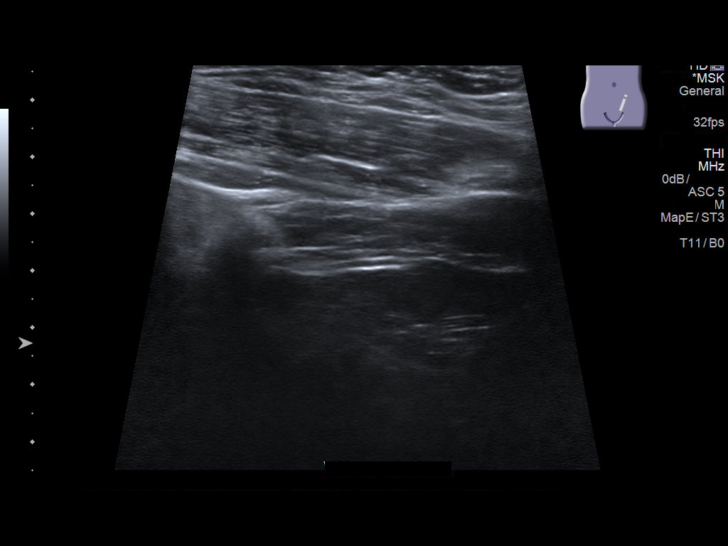
[im 7/19]
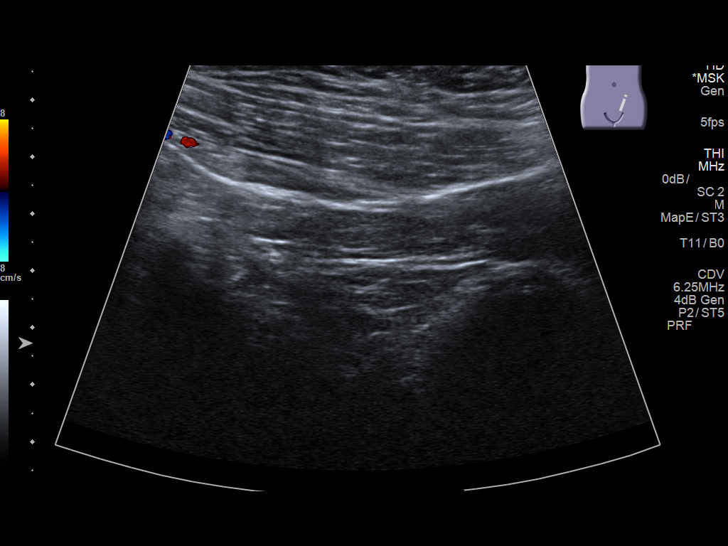
[im 8/19]
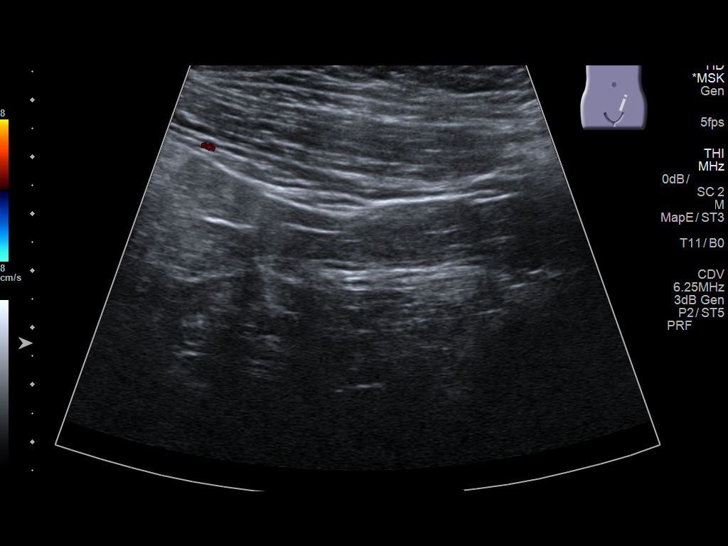
[im 9/19]
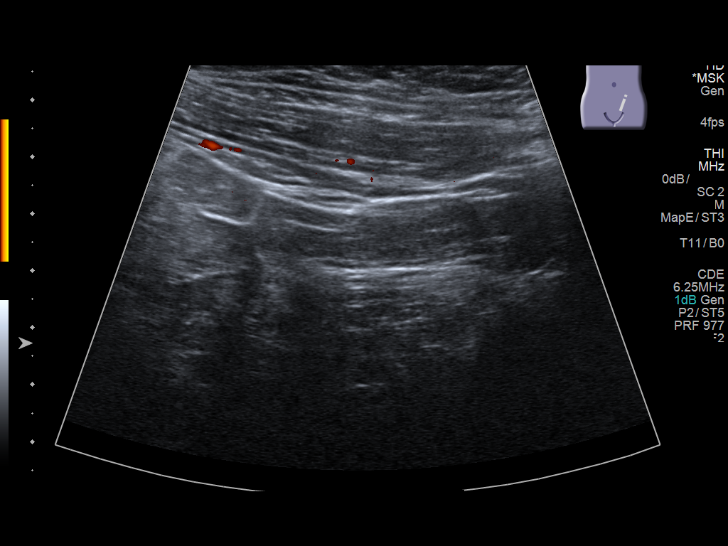
[im 11/19]
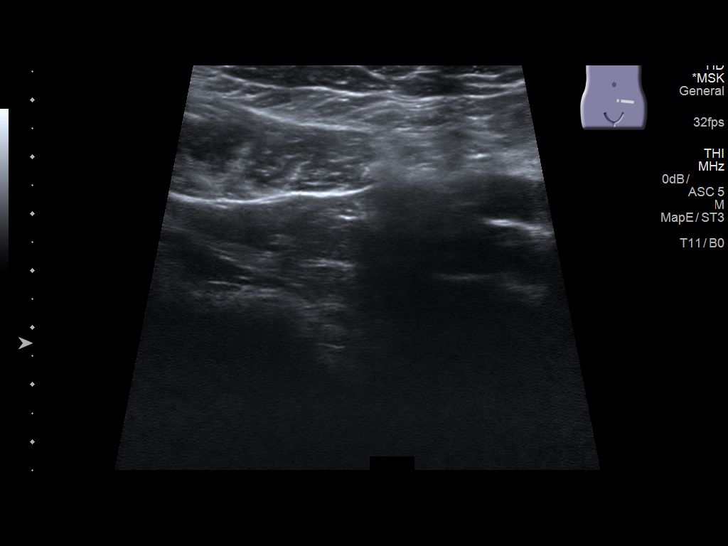
[im 12/19]
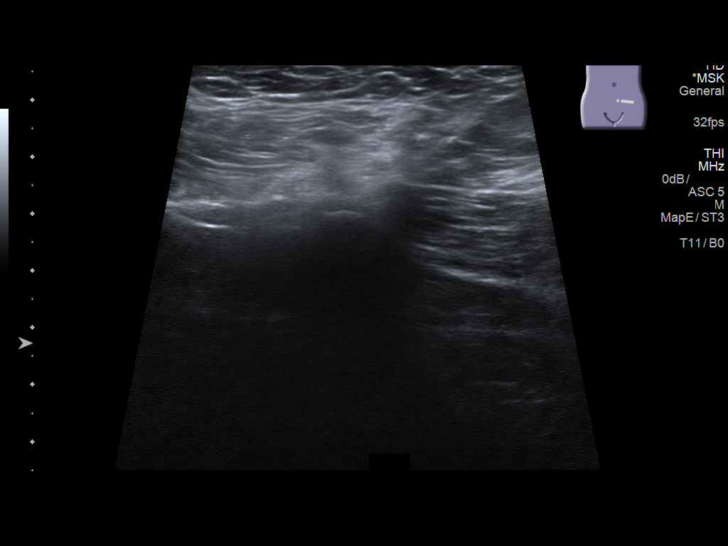
[im 13/19]
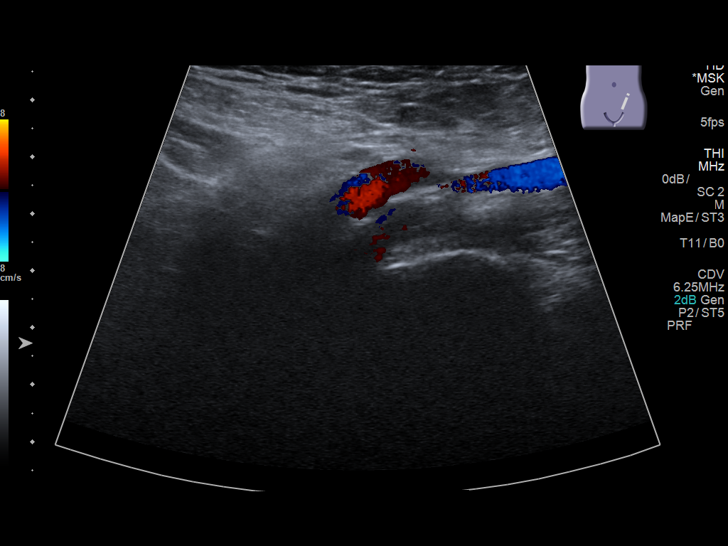
[im 15/19]
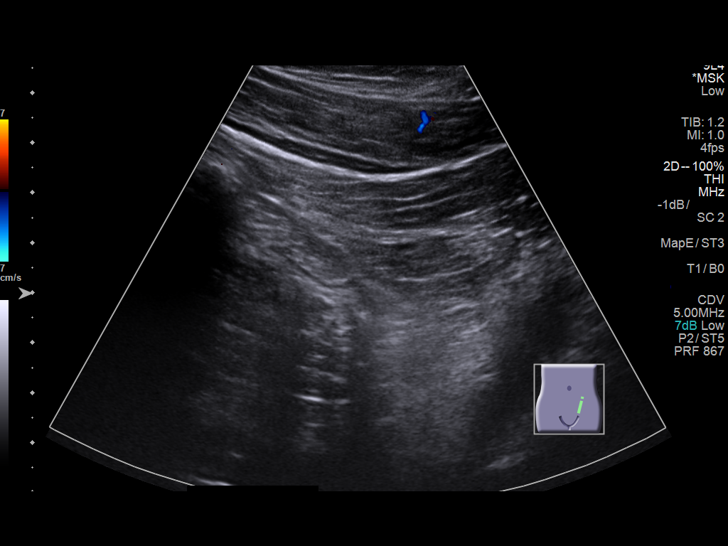
[im 16/19]
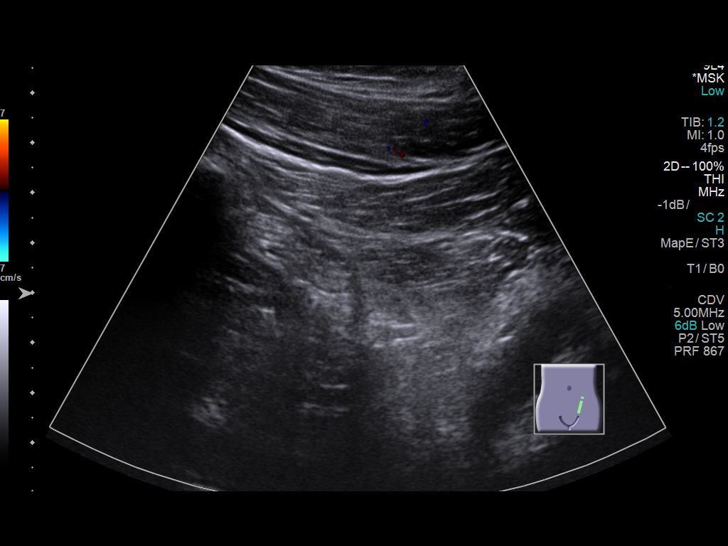
[im 17/19]
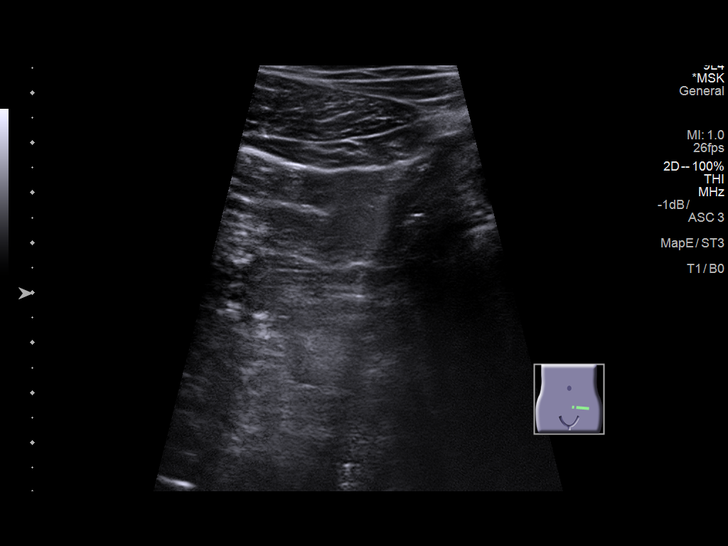
[im 19/19]
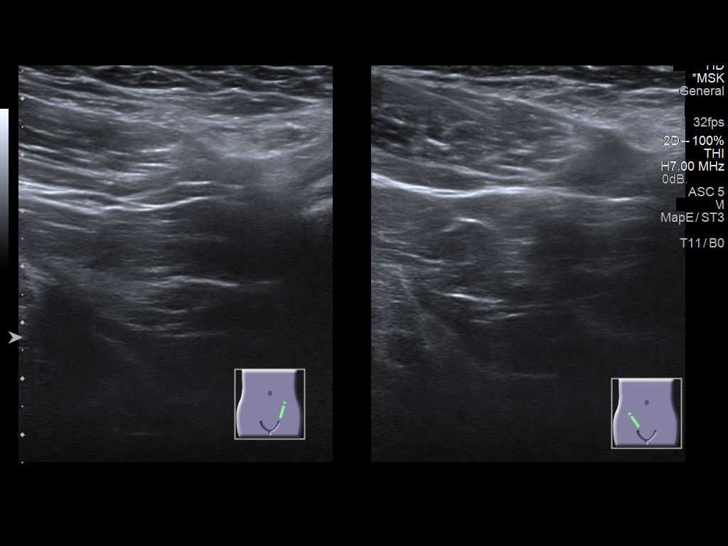

[14 of 19 positions shown; findings below may reference images not displayed]

FINDINGS: No cystic or solid abnormality identified. No evidence of inguinal
hernia. If symptoms persist IV and oral contrast enhanced CT of the
abdomen pelvis suggested.
IMPRESSION: Negative exam.

## 2020-01-03 DIAGNOSIS — J309 Allergic rhinitis, unspecified: Secondary | ICD-10-CM | POA: Diagnosis not present

## 2020-01-03 DIAGNOSIS — Z0000 Encounter for general adult medical examination without abnormal findings: Secondary | ICD-10-CM | POA: Diagnosis not present

## 2020-01-03 DIAGNOSIS — K219 Gastro-esophageal reflux disease without esophagitis: Secondary | ICD-10-CM | POA: Diagnosis not present

## 2020-01-03 DIAGNOSIS — J45909 Unspecified asthma, uncomplicated: Secondary | ICD-10-CM | POA: Diagnosis not present

## 2020-08-08 DIAGNOSIS — Z23 Encounter for immunization: Secondary | ICD-10-CM | POA: Diagnosis not present

## 2020-12-04 DIAGNOSIS — J019 Acute sinusitis, unspecified: Secondary | ICD-10-CM | POA: Diagnosis not present

## 2022-02-07 DIAGNOSIS — B9789 Other viral agents as the cause of diseases classified elsewhere: Secondary | ICD-10-CM | POA: Diagnosis not present

## 2022-02-07 DIAGNOSIS — J019 Acute sinusitis, unspecified: Secondary | ICD-10-CM | POA: Diagnosis not present

## 2022-02-07 DIAGNOSIS — J069 Acute upper respiratory infection, unspecified: Secondary | ICD-10-CM | POA: Diagnosis not present

## 2022-03-18 DIAGNOSIS — J0100 Acute maxillary sinusitis, unspecified: Secondary | ICD-10-CM | POA: Diagnosis not present

## 2022-03-18 DIAGNOSIS — R051 Acute cough: Secondary | ICD-10-CM | POA: Diagnosis not present

## 2022-05-15 DIAGNOSIS — Z0000 Encounter for general adult medical examination without abnormal findings: Secondary | ICD-10-CM | POA: Diagnosis not present

## 2022-05-21 DIAGNOSIS — J45909 Unspecified asthma, uncomplicated: Secondary | ICD-10-CM | POA: Diagnosis not present

## 2022-05-21 DIAGNOSIS — Z13220 Encounter for screening for lipoid disorders: Secondary | ICD-10-CM | POA: Diagnosis not present

## 2022-05-21 DIAGNOSIS — E559 Vitamin D deficiency, unspecified: Secondary | ICD-10-CM | POA: Diagnosis not present

## 2022-05-21 DIAGNOSIS — E291 Testicular hypofunction: Secondary | ICD-10-CM | POA: Diagnosis not present

## 2022-07-23 DIAGNOSIS — Z23 Encounter for immunization: Secondary | ICD-10-CM | POA: Diagnosis not present

## 2022-08-30 DIAGNOSIS — H52203 Unspecified astigmatism, bilateral: Secondary | ICD-10-CM | POA: Diagnosis not present

## 2022-08-30 DIAGNOSIS — H10413 Chronic giant papillary conjunctivitis, bilateral: Secondary | ICD-10-CM | POA: Diagnosis not present

## 2022-12-17 DIAGNOSIS — G919 Hydrocephalus, unspecified: Secondary | ICD-10-CM | POA: Diagnosis not present

## 2023-01-10 DIAGNOSIS — Z03818 Encounter for observation for suspected exposure to other biological agents ruled out: Secondary | ICD-10-CM | POA: Diagnosis not present

## 2023-01-10 DIAGNOSIS — R051 Acute cough: Secondary | ICD-10-CM | POA: Diagnosis not present

## 2023-01-10 DIAGNOSIS — J45909 Unspecified asthma, uncomplicated: Secondary | ICD-10-CM | POA: Diagnosis not present

## 2023-01-10 DIAGNOSIS — J069 Acute upper respiratory infection, unspecified: Secondary | ICD-10-CM | POA: Diagnosis not present

## 2023-06-20 DIAGNOSIS — E559 Vitamin D deficiency, unspecified: Secondary | ICD-10-CM | POA: Diagnosis not present

## 2023-06-20 DIAGNOSIS — E291 Testicular hypofunction: Secondary | ICD-10-CM | POA: Diagnosis not present

## 2023-06-20 DIAGNOSIS — E782 Mixed hyperlipidemia: Secondary | ICD-10-CM | POA: Diagnosis not present

## 2023-06-20 DIAGNOSIS — Z0000 Encounter for general adult medical examination without abnormal findings: Secondary | ICD-10-CM | POA: Diagnosis not present

## 2023-06-20 DIAGNOSIS — R7401 Elevation of levels of liver transaminase levels: Secondary | ICD-10-CM | POA: Diagnosis not present

## 2023-06-20 DIAGNOSIS — Z23 Encounter for immunization: Secondary | ICD-10-CM | POA: Diagnosis not present

## 2023-06-20 DIAGNOSIS — R748 Abnormal levels of other serum enzymes: Secondary | ICD-10-CM | POA: Diagnosis not present

## 2023-06-20 DIAGNOSIS — J45909 Unspecified asthma, uncomplicated: Secondary | ICD-10-CM | POA: Diagnosis not present

## 2023-06-20 DIAGNOSIS — K219 Gastro-esophageal reflux disease without esophagitis: Secondary | ICD-10-CM | POA: Diagnosis not present

## 2023-07-02 DIAGNOSIS — E291 Testicular hypofunction: Secondary | ICD-10-CM | POA: Diagnosis not present

## 2023-07-08 DIAGNOSIS — E291 Testicular hypofunction: Secondary | ICD-10-CM | POA: Diagnosis not present

## 2023-07-18 DIAGNOSIS — E291 Testicular hypofunction: Secondary | ICD-10-CM | POA: Diagnosis not present

## 2023-08-04 DIAGNOSIS — E291 Testicular hypofunction: Secondary | ICD-10-CM | POA: Diagnosis not present

## 2023-08-28 DIAGNOSIS — E291 Testicular hypofunction: Secondary | ICD-10-CM | POA: Diagnosis not present

## 2023-09-11 DIAGNOSIS — E291 Testicular hypofunction: Secondary | ICD-10-CM | POA: Diagnosis not present

## 2023-10-02 DIAGNOSIS — E291 Testicular hypofunction: Secondary | ICD-10-CM | POA: Diagnosis not present

## 2023-11-19 DIAGNOSIS — U071 COVID-19: Secondary | ICD-10-CM | POA: Diagnosis not present

## 2024-01-07 DIAGNOSIS — E291 Testicular hypofunction: Secondary | ICD-10-CM | POA: Diagnosis not present

## 2024-01-30 DIAGNOSIS — E782 Mixed hyperlipidemia: Secondary | ICD-10-CM | POA: Diagnosis not present

## 2024-02-06 DIAGNOSIS — M7501 Adhesive capsulitis of right shoulder: Secondary | ICD-10-CM | POA: Diagnosis not present

## 2024-02-06 DIAGNOSIS — M542 Cervicalgia: Secondary | ICD-10-CM | POA: Diagnosis not present

## 2024-02-26 DIAGNOSIS — M5410 Radiculopathy, site unspecified: Secondary | ICD-10-CM | POA: Diagnosis not present

## 2024-02-26 DIAGNOSIS — M25511 Pain in right shoulder: Secondary | ICD-10-CM | POA: Diagnosis not present

## 2024-03-15 DIAGNOSIS — M25511 Pain in right shoulder: Secondary | ICD-10-CM | POA: Diagnosis not present

## 2024-03-15 DIAGNOSIS — M542 Cervicalgia: Secondary | ICD-10-CM | POA: Diagnosis not present

## 2024-03-16 DIAGNOSIS — M5410 Radiculopathy, site unspecified: Secondary | ICD-10-CM | POA: Diagnosis not present

## 2024-03-16 DIAGNOSIS — M25511 Pain in right shoulder: Secondary | ICD-10-CM | POA: Diagnosis not present

## 2024-03-29 DIAGNOSIS — M25511 Pain in right shoulder: Secondary | ICD-10-CM | POA: Diagnosis not present

## 2024-03-29 DIAGNOSIS — M5410 Radiculopathy, site unspecified: Secondary | ICD-10-CM | POA: Diagnosis not present

## 2024-04-15 DIAGNOSIS — M25511 Pain in right shoulder: Secondary | ICD-10-CM | POA: Diagnosis not present

## 2024-04-15 DIAGNOSIS — M5410 Radiculopathy, site unspecified: Secondary | ICD-10-CM | POA: Diagnosis not present

## 2024-05-13 DIAGNOSIS — E291 Testicular hypofunction: Secondary | ICD-10-CM | POA: Diagnosis not present

## 2024-06-22 DIAGNOSIS — E559 Vitamin D deficiency, unspecified: Secondary | ICD-10-CM | POA: Diagnosis not present

## 2024-06-22 DIAGNOSIS — E782 Mixed hyperlipidemia: Secondary | ICD-10-CM | POA: Diagnosis not present

## 2024-06-22 DIAGNOSIS — R7303 Prediabetes: Secondary | ICD-10-CM | POA: Diagnosis not present

## 2024-06-22 DIAGNOSIS — Z23 Encounter for immunization: Secondary | ICD-10-CM | POA: Diagnosis not present

## 2024-06-22 DIAGNOSIS — Z0000 Encounter for general adult medical examination without abnormal findings: Secondary | ICD-10-CM | POA: Diagnosis not present

## 2024-06-22 DIAGNOSIS — E669 Obesity, unspecified: Secondary | ICD-10-CM | POA: Diagnosis not present

## 2024-06-22 DIAGNOSIS — K219 Gastro-esophageal reflux disease without esophagitis: Secondary | ICD-10-CM | POA: Diagnosis not present

## 2024-06-22 DIAGNOSIS — E291 Testicular hypofunction: Secondary | ICD-10-CM | POA: Diagnosis not present

## 2024-06-22 DIAGNOSIS — J45909 Unspecified asthma, uncomplicated: Secondary | ICD-10-CM | POA: Diagnosis not present

## 2024-09-13 DIAGNOSIS — R051 Acute cough: Secondary | ICD-10-CM | POA: Diagnosis not present

## 2024-09-13 DIAGNOSIS — R52 Pain, unspecified: Secondary | ICD-10-CM | POA: Diagnosis not present

## 2024-09-13 DIAGNOSIS — R509 Fever, unspecified: Secondary | ICD-10-CM | POA: Diagnosis not present

## 2024-09-13 DIAGNOSIS — J029 Acute pharyngitis, unspecified: Secondary | ICD-10-CM | POA: Diagnosis not present

## 2024-09-13 DIAGNOSIS — R0981 Nasal congestion: Secondary | ICD-10-CM | POA: Diagnosis not present
